# Patient Record
Sex: Male | Born: 1955 | Race: White | Hispanic: No | Marital: Married | Smoking: Never smoker
Health system: Southern US, Community
[De-identification: ages and names within clinical notes are randomized; demographics above are authoritative.]

## PROBLEM LIST (undated history)

## (undated) DIAGNOSIS — R42 Dizziness and giddiness: Secondary | ICD-10-CM

## (undated) HISTORY — PX: BUNIONECTOMY: SHX129

## (undated) HISTORY — PX: TONSILLECTOMY: SUR1361

## (undated) HISTORY — PX: OTHER SURGICAL HISTORY: SHX169

---

## 2001-10-03 ENCOUNTER — Emergency Department (HOSPITAL_COMMUNITY): Admission: EM | Admit: 2001-10-03 | Discharge: 2001-10-03 | Payer: Self-pay | Admitting: Emergency Medicine

## 2001-10-03 ENCOUNTER — Encounter: Payer: Self-pay | Admitting: Emergency Medicine

## 2014-07-02 ENCOUNTER — Ambulatory Visit (INDEPENDENT_AMBULATORY_CARE_PROVIDER_SITE_OTHER): Payer: 59

## 2014-07-02 ENCOUNTER — Ambulatory Visit (INDEPENDENT_AMBULATORY_CARE_PROVIDER_SITE_OTHER): Payer: 59 | Admitting: Podiatry

## 2014-07-02 ENCOUNTER — Encounter: Payer: Self-pay | Admitting: Podiatry

## 2014-07-02 ENCOUNTER — Ambulatory Visit: Payer: 59

## 2014-07-02 VITALS — BP 130/80 | HR 67 | Resp 16

## 2014-07-02 DIAGNOSIS — M205X2 Other deformities of toe(s) (acquired), left foot: Secondary | ICD-10-CM

## 2014-07-02 DIAGNOSIS — M2011 Hallux valgus (acquired), right foot: Secondary | ICD-10-CM

## 2014-07-02 DIAGNOSIS — M2041 Other hammer toe(s) (acquired), right foot: Secondary | ICD-10-CM

## 2014-07-02 NOTE — Progress Notes (Signed)
   Subjective:    Patient ID: Derrick Manning, male    DOB: 07-16-1955, 58 y.o.   MRN: [id number]  HPI Comments: "I wanted to get my check up for the end of the year"  Patient c/o 1st MPJ and 2nd toe right foot for several years. The area is larger and the toe looks more crooked. He just wants an evaluation. Any preventative measures?  Foot Pain  Toe Pain       Review of Systems  All other systems reviewed and are negative.      Objective:   Physical Exam: I have reviewed his past medical history medications allergies surgery social history and review of systems. Ulcers are strongly palpable bilateral. Neurologic sensorium is intact percent lasting monofilament. Deep tendon reflexes are intact bilateral muscle strength +5 over 5 dorsiflexion plantar flexors and inverters everters all intrinsic musculature is intact. Orthopedic evaluation demonstrates all joints distal to the ankle for range of motion with the exception of the first metatarsophalangeal joint of the left foot. He does have hallux abductovalgus deformity otherwise noted his bunion deformity to the first metatarsophalangeal joint of the right foot which is resulting in a mild dorsal contraction of the second metatarsophalangeal joint. This is resulting in a mild hammertoe deformity. Radiographically the left foot demonstrates mild osteoarthritis to Lisfranc's joints or the midfoot. First metatarsophalangeal joint left foot demonstrates a completely destroyed joint absolutely no joint space is present dorsal spurring medial spurring secondary to a slightly elevated first metatarsal or possibly trauma. Joint space narrowing and subchondral sclerosis subchondral eburnation and spurring demonstrating hallux rigidus. The contralateral foot demonstrates hallux valgus deformity with an increase in the first intermetatarsal angle and some joint space narrowing with dorsal spurring just beginning. Hammertoe deformity with an elongated second  metatarsal is prominent second right. Joint space narrowing with dorsal spurring is also noted midfoot right. Cutaneous evaluation demonstrates a well-hydrated cutis some irritation of the medial aspect of the PIPJ second right associated with the juxtaposition of the right hallux. Very small excoriation to the medial aspect of the second digit left. Neither of which seemed to be infected.        Assessment & Plan:  Assessment: Hallux rigidus left foot. Hallux abductovalgus deformity (bunion) right foot. Hammertoe deformity second digit right foot. Osteoarthritis mid foot bilateral. Dorsal spurring secondary to osteoarthritis right foot.  Plan: We discussed the etiology pathology conservative versus surgical therapies. At this point he was supplied with several different types of padding to help prevent skin breakdown between his toes. We did discuss the possible surgical interventions and he will consider these for the near future. I will follow-up with him approximately 1 month prior to surgical intervention.

## 2014-07-02 NOTE — Patient Instructions (Addendum)
Bunion (Hallux Valgus) A bony bump (protrusion) on the inside of the foot, at the base of the first toe, is called a bunion (hallux valgus). A bunion causes the first toe to angle toward the other toes. SYMPTOMS   A bony bump on the inside of the foot, causing an outward turning of the first toe. It may also overlap the second toe.  Thickening of the skin (callus) over the bony bump.  Fluid buildup under the callus. Fluid may become red, tender, and swollen (inflamed) with constant irritation or pressure.  Foot pain and stiffness. CAUSES  Many causes exist, including:  Inherited from your family (genetics).  Injury (trauma) forcing the first toe into a position in which it overlaps other toes.  Bunions are also associated with wearing shoes that have a narrow toe box (pointy shoes). RISK INCREASES WITH:  Family history of foot abnormalities, especially bunions.  Arthritis.  Narrow shoes, especially high heels. PREVENTION  Wear shoes with a wide toe box.  Avoid shoes with high heels.  Wear a small pad between the big toe and second toe.  Maintain proper conditioning:  Foot and ankle flexibility.  Muscle strength and endurance. PROGNOSIS  With proper treatment, bunions can typically be cured. Occasionally, surgery is required.  RELATED COMPLICATIONS   Infection of the bunion.  Arthritis of the first toe.  Risks of surgery, including infection, bleeding, injury to nerves (numb toe), recurrent bunion, overcorrection (toe points inward), arthritis of the big toe, big toe pointing upward, and bone not healing. TREATMENT  Treatment first consists of stopping the activities that aggravate the pain, taking pain medicines, and icing to reduce inflammation and pain. Wear shoes with a wide toe box. Shoes can be modified by a shoe repair person to relieve pressure on the bunion, especially if you cannot find shoes with a wide enough toe box. You may also place a pad with the  center cut out in your shoe, to reduce pressure on the bunion. Sometimes, an arch support (orthotic) may reduce pressure on the bunion and alleviate the symptoms. Stretching and strengthening exercises for the muscles of the foot may be useful. You may choose to wear a brace or pad at night to hold the big toe away from the second toe. If non-surgical treatments are not successful, surgery may be needed. Surgery involves removing the overgrown tissue and correcting the position of the first toe, by realigning the bones. Bunion surgery is typically performed on an outpatient basis, meaning you can go home the same day as surgery. The surgery may involve cutting the mid portion of the bone of the first toe, or just cutting and repairing (reconstructing) the ligaments and soft tissues around the first toe.  MEDICATION   If pain medicine is needed, nonsteroidal anti-inflammatory medicines, such as aspirin and ibuprofen, or other minor pain relievers, such as acetaminophen, are often recommended.  Do not take pain medicine for 7 days before surgery.  Prescription pain relievers are usually only prescribed after surgery. Use only as directed and only as much as you need.  Ointments applied to the skin may be helpful. HEAT AND COLD  Cold treatment (icing) relieves pain and reduces inflammation. Cold treatment should be applied for 10 to 15 minutes every 2 to 3 hours for inflammation and pain and immediately after any activity that aggravates your symptoms. Use ice packs or an ice massage.  Heat treatment may be used prior to performing the stretching and strengthening activities prescribed by your   caregiver, physical therapist, or athletic trainer. Use a heat pack or a warm soak. SEEK MEDICAL CARE IF:   Symptoms get worse or do not improve in 2 weeks, despite treatment.  After surgery, you develop fever, increasing pain, redness, swelling, drainage of fluids, bleeding, or increasing warmth around the  surgical area.  New, unexplained symptoms develop. (Drugs used in treatment may produce side effects.) Document Released: 06/21/2005 Document Revised: 09/13/2011 Document Reviewed: 10/03/2008 [facility] Patient Information 2015 [employer]. This information is not intended to replace advice given to you by your health care provider. Make sure you discuss any questions you have with your health care provider.      Hammer Toes Hammer toes is a condition in which one or more of your toes is permanently flexed. CAUSES  This happens when a muscle imbalance or abnormal bone length makes your small toes buckle. This causes the toe joint to contract and the strong cord-like bands that attach muscles to the bones (tendons) in your toes to shorten.  SIGNS AND SYMPTOMS  Common symptoms of flexible hammer toes include:   A buildup of skin cells (corns). Corns occur where boney bumps come in frequent contact with hard surfaces. For example, where your shoes press and rub.  Irritation.  Inflammation.  Pain.  Limited motion in your toes. DIAGNOSIS  Hammer toes are diagnosed through a physical exam of your toes. During the exam, your health care provider may try to reproduce your symptoms by manipulating your foot. Often, X-ray exams are done to determine the degree of deformity and to make sure that the cause is not a fracture.  TREATMENT  Hammer toes can be treated with corrective surgery. There are several types of surgical procedures that can treat hammer toes. The most common procedures include:  Arthroplasty--A portion of the joint is surgically removed and your toe is straightened. The gap fills in with fibrous tissue. This procedure helps treat pain and deformity and helps restore function.  Fusion--Cartilage between the two bones of the affected joint is taken out and the bones fuse together into one longer bone. This helps keep your toe stable and reduces pain but leaves your toe stiff,  yet straight.  Implantation--A portion of your bone is removed and replaced with an implant to restore motion.  Flexor tendon transfers--This procedure repositions the tendons that curl the toes down (flexor tendons). This may be done to release the deforming force that causes your toe to buckle. Several of these procedures require fixing your toe with a pin that is visible at the tip of your toe. The pin keeps the toe straight during healing. Your health care provider will remove the pin usually within 4-8 weeks after the procedure.  Document Released: 06/18/2000 Document Revised: 06/26/2013 Document Reviewed: 02/26/2013 [facility] Patient Information 2015 [employer], [place]. This information is not intended to replace advice given to you by your health care provider. Make sure you discuss any questions you have with your health care provider.    Hallux Limitus Hallux limitus is a condition involving pain and a loss of motion of the first (big) toe. The pain gets worse with lifting up (extension) of the toe. This is usually due to arthritic bony bumps (spurring) of the joint at the base of the big toe.  SYMPTOMS   Pain, with lifting up of the toe.  Tenderness over the joint where the big toe meets the foot.  Redness, swelling, and warmth over the top of the base of the  big toe (sometimes).  Foot pain, stiffness, and limping. CAUSES  Hallux limitus is caused by arthritis of the joint where the big toe meets the foot. The arthritis creates a bone spur that pinches the soft tissues when the toe is extended. RISK INCREASES WITH:  Tight shoes with a narrow toe box.  Family history of foot problems.  Gout and rheumatoid and psoriatic arthritis.  History of previous toe injury, including "turf toe."  Long first toe, flat feet, and other big toe bony bumps.  Arthritis of the big toe. PREVENTION   Wear wide-toed shoes that fit well.  Tape the big toe to reduce motion and to prevent  pinching of the tissues between the bone.  Maintain physical fitness:  Foot and ankle flexibility.  Muscle strength and endurance. PROGNOSIS  This condition can usually be managed with proper treatment. However, surgery is typically required to prevent the problem from recurring.  RELATED COMPLICATIONS  Injury to other areas of the foot or ankle, caused by abnormal walking in an attempt to avoid the pain felt when walking normally. TREATMENT Treatment first involves stopping the activities that aggravate your symptoms. Ice and medicine can be used to reduce the pain and inflammation. Modifications to shoes may help reduce pain, including wearing stiff-soled shoes, shoes with a wide toe box, inserting a padded donut to relieve pressure on top of the joint, or wearing an arch support. Corticosteroid injections may be given to reduce inflammation. If nonsurgical treatment is unsuccessful, surgery may be needed. Surgical options include removing the arthritic bony spur, cutting a bone in the foot to change the arc of motion (allowing the toe to extend more), or fusion of the joint (eliminating all motion in the joint at the base of the big toe).  MEDICATION   If pain medicine is needed, nonsteroidal anti-inflammatory medicines (aspirin and ibuprofen), or other minor pain relievers (acetaminophen), are often advised.  Do not take pain medicine for 7 days before surgery.  Prescription pain relievers are usually prescribed only after surgery. Use only as directed and only as much as you need.  Ointments for arthritis, applied to the skin, may give some relief.  Injections of corticosteroids may be given to reduce inflammation. HEAT AND COLD  Cold treatment (icing) relieves pain and reduces inflammation. Cold treatment should be applied for 10 to 15 minutes every 2 to 3 hours, and immediately after activity that aggravates your symptoms. Use ice packs or an ice massage.  Heat treatment may be  used before performing the stretching and strengthening activities prescribed by your caregiver, physical therapist, or athletic trainer. Use a heat pack or a warm water soak. SEEK MEDICAL CARE IF:   Symptoms get worse or do not improve in 2 weeks, despite treatment.  After surgery you develop fever, increasing pain, redness, swelling, drainage of fluids, bleeding, or increasing warmth.  New, unexplained symptoms develop.

## 2015-09-15 ENCOUNTER — Ambulatory Visit (INDEPENDENT_AMBULATORY_CARE_PROVIDER_SITE_OTHER): Payer: Commercial Managed Care - HMO

## 2015-09-15 ENCOUNTER — Encounter: Payer: Self-pay | Admitting: Podiatry

## 2015-09-15 ENCOUNTER — Ambulatory Visit (INDEPENDENT_AMBULATORY_CARE_PROVIDER_SITE_OTHER): Payer: Commercial Managed Care - HMO | Admitting: Podiatry

## 2015-09-15 VITALS — BP 131/70 | HR 70 | Resp 16

## 2015-09-15 DIAGNOSIS — M2011 Hallux valgus (acquired), right foot: Secondary | ICD-10-CM

## 2015-09-15 NOTE — Progress Notes (Signed)
He presents today to discuss bunion surgery that he would like to have in January of next year. He states that he would just like to talk to me about a healed and what to expect. He states this seems to be getting worse. He's been told by his wife and daughter not to have anything done that will only make it worse.  Objective: Vital signs are stable alert and oriented 3. Pulses are palpable. Moderate severe hallux abductovalgus deformity of the right foot with lateral deviation of the toe resulting in skin breakdown the medial aspect of the PIPJ second digit right. He states that he still has good range of motion of the joint and on physical examination appears to be so. Contralateral foot however does demonstrate limited range of motion more hallux rigidus style. Radiographs reviewed once again today to demonstrate an increase in the first metatarsal angle greater than normal values hallux abductus angle greater than normal values and early osteoarthritic changes.  Assessment: Hallux valgus deformity of the right foot mild hammertoe deformity second digit right foot.  Plan: We discussed etiology and pathology conservative versus surgical therapies. At this point if we were going to schedule him today [facility] However since he would like to have this done in January I suggested he follow up with me in July or August for a consent.

## 2016-01-20 ENCOUNTER — Encounter: Payer: Self-pay | Admitting: Podiatry

## 2016-01-20 ENCOUNTER — Ambulatory Visit (INDEPENDENT_AMBULATORY_CARE_PROVIDER_SITE_OTHER): Payer: Commercial Managed Care - HMO | Admitting: Podiatry

## 2016-01-20 VITALS — BP 140/76 | HR 75 | Resp 16

## 2016-01-20 DIAGNOSIS — M2011 Hallux valgus (acquired), right foot: Secondary | ICD-10-CM | POA: Diagnosis not present

## 2016-01-20 DIAGNOSIS — M2041 Other hammer toe(s) (acquired), right foot: Secondary | ICD-10-CM | POA: Diagnosis not present

## 2016-01-20 NOTE — Progress Notes (Signed)
Mr. Derrick Manning presents today for surgical consult regarding his right foot. He states that his bunion deformity appears to be getting worse and is more painful as time goes on. He states that now that he is retired he is much more active and is feeling pains of the deformity. He states this seems to be getting worse on a monthly basis at this point. He would like to consider surgical intervention January 2018. He is also concerned about a slight contracture deformity at the second metatarsophalangeal joint and a bump to the dorsal aspect of the right foot which is painful with shoe gear particularly laces and hiking boots. He denies changes in his past medical history medications and allergies. States that he has absolutely no drug allergies and takes Cialis on an as-needed basis.  Objective: Vital signs are stable he is alert and oriented 3. Pulses are strongly palpable. Neurologic sensorium is intact per Semmes-Weinstein monofilament and deep tendon reflexes are intact bilateral. Muscle strength is normal bilaterally and symmetrical. Orthopedic evaluation demonstrates hallux abductovalgus deformity of the right foot with no crepitation. Appears to be tracking but not completely track bound. He does have slight flexor contracture at the second metatarsophalangeal joint and minimal hammertoe deformity. Dorsal spurring at the level of the second metatarsal intermediate cuneiform joint is noted and painful on palpation with compression of the deep peroneal nerve. Radiographs reviewed today does demonstrate osteophytic changes and osteoarthritic changes of the second metatarsal medial cuneiform joint. Hallux abductovalgus deformity of the right foot and a slight contracture deformity at the second metatarsophalangeal joint. Bone stock appears to be normal and healthy. Cutaneous evaluation demonstrates no open lesions or wounds. Mild erythema overlying the hypertrophic medial condyle and medial eminence of the first  metatarsophalangeal joint.  Assessment: Hallux abductovalgus deformity of the right foot is noted. Contracted metatarsophalangeal joint second right. Hammertoe deformity second right. 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  Plan: We discussed the etiology pathology conservative versus surgical therapies. At this point he would like to have surgical correction of these 3 primary complications. We consented in today for a [facility] bunion repair with screw fixation a release of the second metatarsophalangeal joint as well as a dorsal tarsal exostectomy. I answered all the questions regarding these procedures to the best of my ability in layman's terms He understood this was amenable to it and signed all 3 pages of the consent form. We dispensed a Cam Walker which he will bring with him on the date of surgery we discussed [facility] and that this will require general anesthesia as well as a sciatic nerve block. We dispensed paperwork terms preoperative instructions for his preoperative use the day before surgery. We will follow-up with him in January.

## 2016-01-20 NOTE — Patient Instructions (Signed)

## 2016-07-15 ENCOUNTER — Other Ambulatory Visit: Payer: Self-pay | Admitting: Podiatry

## 2016-07-15 MED ORDER — ONDANSETRON HCL 4 MG PO TABS: 4.0000 mg | ORAL_TABLET | Freq: Three times a day (TID) | ORAL | 0 refills | Status: DC | PRN

## 2016-07-15 MED ORDER — HYDROMORPHONE HCL 4 MG PO TABS: 4.0000 mg | ORAL_TABLET | ORAL | 0 refills | Status: DC | PRN

## 2016-07-15 MED ORDER — CEPHALEXIN 500 MG PO CAPS: 500.0000 mg | ORAL_CAPSULE | Freq: Three times a day (TID) | ORAL | 0 refills | Status: DC

## 2016-07-16 ENCOUNTER — Encounter: Payer: Self-pay | Admitting: Podiatry

## 2016-07-16 DIAGNOSIS — M25774 Osteophyte, right foot: Secondary | ICD-10-CM | POA: Diagnosis not present

## 2016-07-16 DIAGNOSIS — M7751 Other enthesopathy of right foot: Secondary | ICD-10-CM | POA: Diagnosis not present

## 2016-07-16 DIAGNOSIS — M2011 Hallux valgus (acquired), right foot: Secondary | ICD-10-CM | POA: Diagnosis not present

## 2016-07-19 ENCOUNTER — Telehealth: Payer: Self-pay

## 2016-07-19 NOTE — Telephone Encounter (Signed)
Poke with pt regarding post op status. He states that he is doing well and managing his pain with only use of Motrin. Advised to continue with ice, elevation and rest and remain in boot at all times. He denied any s/s of infection or fever. He is to call with any acute symptom changes or concerns

## 2016-07-22 ENCOUNTER — Other Ambulatory Visit: Payer: Self-pay

## 2016-07-23 ENCOUNTER — Ambulatory Visit (INDEPENDENT_AMBULATORY_CARE_PROVIDER_SITE_OTHER): Payer: Commercial Managed Care - HMO

## 2016-07-23 ENCOUNTER — Ambulatory Visit (INDEPENDENT_AMBULATORY_CARE_PROVIDER_SITE_OTHER): Payer: Self-pay | Admitting: Podiatry

## 2016-07-23 DIAGNOSIS — M2011 Hallux valgus (acquired), right foot: Secondary | ICD-10-CM | POA: Diagnosis not present

## 2016-07-25 NOTE — Progress Notes (Signed)
Subjective:     Patient ID: Derrick Manning, male   DOB: 1955/12/27, 61 y.o.   MRN: [id number]  HPI patient states she's doing well with his right foot with mild discomfort and swelling if he walks extended.   Review of Systems     Objective:   Physical Exam Neurovascular status intact negative Homans sign was noted with well coapted incision site right first metatarsal dorsum right foot with wound edges coapted well and no drainage    Assessment:     Doing well post surgery right foot    Plan:     Advised on continued elevation compression and reapplied sterile dressing. Reappoint one week for reevaluation or earlier if any issues should occur  X-rays indicate good healing osteotomy with fixation in place and good reduction of intermetatarsal angle

## 2016-08-03 ENCOUNTER — Encounter: Payer: Self-pay | Admitting: Podiatry

## 2016-08-03 ENCOUNTER — Ambulatory Visit (INDEPENDENT_AMBULATORY_CARE_PROVIDER_SITE_OTHER): Payer: Commercial Managed Care - HMO | Admitting: Podiatry

## 2016-08-03 DIAGNOSIS — M2011 Hallux valgus (acquired), right foot: Secondary | ICD-10-CM

## 2016-08-03 NOTE — Progress Notes (Signed)
He presents today for postop visit date of surgery 07/16/2016 status post Derrick Manning bunionectomy right capsulotomy with release of the metatarsophalangeal joint and a tarsal exostectomy right foot. He states that he is doing fine with minimal discomfort. He states that he did help friend with his furniture the other day while recovering.  Objective: Vital signs are stable he is alert and oriented 3. Sterile dressing was removed demonstrates sutures are intact margins were coapted removed the suture ends today. He has good range of motion though painful first metatarsophalangeal joint of the right foot.  Assessment: Well-healing surgical foot right.  Plan: Encouraged him to keep his foot elevated and to increase his activity level with his toes and range of motion of his ankle. We will follow-up with him in 2 weeks at which time radiographs will be taken we did place him in an anklet and a Darco shoe today. He understands that he is still limited in his activities.

## 2016-08-12 NOTE — Progress Notes (Signed)
DOS 01.12.2018 [place] bunion repair right foot with screws. Release 2nd MTPj right foot. Dorsal tarsal exostectomy right foot.

## 2016-08-17 ENCOUNTER — Ambulatory Visit (INDEPENDENT_AMBULATORY_CARE_PROVIDER_SITE_OTHER): Payer: Commercial Managed Care - HMO

## 2016-08-17 ENCOUNTER — Ambulatory Visit (INDEPENDENT_AMBULATORY_CARE_PROVIDER_SITE_OTHER): Payer: Self-pay | Admitting: Podiatry

## 2016-08-17 DIAGNOSIS — M2011 Hallux valgus (acquired), right foot: Secondary | ICD-10-CM

## 2016-08-17 NOTE — Progress Notes (Signed)
He presents today for a second postop visit [facility]. He states the swelling has gone down considerably and the foot feels much better he states that he's been up on it going to the gym and being quite active. States he continues to wear his Darco shoe at all times.  Objective: All signs are stable he is alert and oriented 3. Pulses are palpable. He's had good range of motion on dorsiflexion and plantar flexion of the first metatarsophalangeal joint second metatarsophalangeal joint is in rectus position. No open lesions or wounds are noted. Radius taken today demonstrate good position of the capital osteotomy and screw fixation. No fractures are identified. There has been slight motion of the distal fragment but no large degree.  Assessment: 1 healing surgical foot.  Plan: We'll allow him to get back into regular tennis shoe and I'll follow-up with him in 1 month.

## 2016-08-31 ENCOUNTER — Ambulatory Visit (INDEPENDENT_AMBULATORY_CARE_PROVIDER_SITE_OTHER): Payer: Self-pay | Admitting: Sports Medicine

## 2016-08-31 ENCOUNTER — Ambulatory Visit (INDEPENDENT_AMBULATORY_CARE_PROVIDER_SITE_OTHER): Payer: Commercial Managed Care - HMO

## 2016-08-31 DIAGNOSIS — M898X9 Other specified disorders of bone, unspecified site: Secondary | ICD-10-CM | POA: Diagnosis not present

## 2016-08-31 DIAGNOSIS — M2011 Hallux valgus (acquired), right foot: Secondary | ICD-10-CM

## 2016-08-31 DIAGNOSIS — Z9889 Other specified postprocedural states: Secondary | ICD-10-CM

## 2016-08-31 NOTE — Progress Notes (Signed)
Subjective: Derrick Manning is a 61 y.o. male patient seen today in office for POV #4 (DOS 07-16-16), S/P Right bunionectomy and dorsal midfoot exostectomy performed by Dr. Milinda Pointer. Patient denies pain at surgical site, denies calf pain, denies headache, chest pain, shortness of breath, nausea, vomiting, fever, or chills. Patient states that he is doing well and has been in normal shoes since last week. No other issues noted.   There are no active problems to display for this patient.   Current Outpatient Prescriptions on File Prior to Visit  Medication Sig Dispense Refill  . HYDROmorphone (DILAUDID) 4 MG tablet Take 1 tablet (4 mg total) by mouth every 4 (four) hours as needed for severe pain. 30 tablet 0  . ondansetron (ZOFRAN) 4 MG tablet Take 1 tablet (4 mg total) by mouth every 8 (eight) hours as needed. 20 tablet 0   No current facility-administered medications on file prior to visit.     No Known Allergies  Objective: There were no vitals filed for this visit.  General: No acute distress, AAOx3  Right Foot: Incision sites well healed, no erythema, no warmth, no drainage, no signs of infection noted, Capillary fill time <3 seconds in all digits, gross sensation present via light touch to right foot. No pain or crepitation with range of motion right foot.  No pain with calf compression.   Post Op Xray, Right foot: 1st met in good alignment and position, There is dorsal lateral capital fragment that is prominent from lateral shift likely a part of surgery to reduce IM vs slight motion. Osteotomy site healing. Hardware intact. No other acute bony findings. Soft tissue swelling within normal limits for post op status.   Assessment and Plan:  Problem List Items Addressed This Visit    None    Visit Diagnoses    Hav (hallux abducto valgus), right    -  Primary   Relevant Orders   DG Foot Complete Right (Completed)   Exostosis, dorsal, right       POV   Relevant Orders   DG Foot Complete  Right (Completed)   S/P foot surgery, right           -Patient seen and evaluated -Xrays reviewed  -Advised patient to limit activity to necessity; no strenous exercise or loading ball of foot until next visit when cleared to do so my Dr. Milinda Pointer -May continue gentle range of motion exercises and good supportive tennis shoes  -Will plan for xray and discuss increase in physical activity at next office visit with Dr. Milinda Pointer. In the meantime, patient to call office if any issues or problems arise.   Landis Martins, DPM

## 2016-09-28 ENCOUNTER — Ambulatory Visit (INDEPENDENT_AMBULATORY_CARE_PROVIDER_SITE_OTHER): Payer: Commercial Managed Care - HMO

## 2016-09-28 ENCOUNTER — Encounter: Payer: Self-pay | Admitting: Podiatry

## 2016-09-28 ENCOUNTER — Ambulatory Visit (INDEPENDENT_AMBULATORY_CARE_PROVIDER_SITE_OTHER): Payer: Self-pay | Admitting: Podiatry

## 2016-09-28 VITALS — BP 130/80 | HR 63 | Resp 16

## 2016-09-28 DIAGNOSIS — M2011 Hallux valgus (acquired), right foot: Secondary | ICD-10-CM

## 2016-09-28 DIAGNOSIS — M898X9 Other specified disorders of bone, unspecified site: Secondary | ICD-10-CM

## 2016-09-29 NOTE — Progress Notes (Signed)
He presents today follow-up of his Derrick Manning bunionectomy date of surgery 07/16/2016. He states that is doing just great no pain in any way.  Objective: Vital signs are stable he is alert and oriented 3 no erythema or edema cellulitis drainage or odor great range of motion of the first metatarsophalangeal joint. Radiographs confirm well-healed osteotomies.  Assessment: Well-healed osteotomy dorsal tarsal exostectomy and released metatarsophalangeal joint.  Plan: We'll get back to his regular routine follow-up with me on an as-needed basis or in one month.

## 2019-02-06 ENCOUNTER — Other Ambulatory Visit: Payer: Self-pay | Admitting: Family Medicine

## 2019-02-06 DIAGNOSIS — Z720 Tobacco use: Secondary | ICD-10-CM

## 2019-02-20 ENCOUNTER — Ambulatory Visit: Payer: Self-pay

## 2019-02-21 ENCOUNTER — Ambulatory Visit
Admission: RE | Admit: 2019-02-21 | Discharge: 2019-02-21 | Disposition: A | Discharge status: Home / Self Care | Payer: Medicare Other | Source: Ambulatory Visit | Attending: Family Medicine | Admitting: Family Medicine

## 2019-02-21 DIAGNOSIS — Z720 Tobacco use: Secondary | ICD-10-CM

## 2020-09-11 ENCOUNTER — Other Ambulatory Visit: Payer: Self-pay | Admitting: Family Medicine

## 2020-09-11 DIAGNOSIS — N63 Unspecified lump in unspecified breast: Secondary | ICD-10-CM

## 2020-09-23 ENCOUNTER — Other Ambulatory Visit: Payer: Self-pay

## 2020-09-23 ENCOUNTER — Ambulatory Visit
Admission: RE | Admit: 2020-09-23 | Discharge: 2020-09-23 | Disposition: A | Discharge status: Home / Self Care | Payer: Medicare Other | Source: Ambulatory Visit | Attending: Family Medicine | Admitting: Family Medicine

## 2020-09-23 DIAGNOSIS — N63 Unspecified lump in unspecified breast: Secondary | ICD-10-CM

## 2020-09-30 ENCOUNTER — Other Ambulatory Visit: Payer: Medicare Other

## 2021-02-02 ENCOUNTER — Other Ambulatory Visit: Payer: Self-pay | Admitting: Physician Assistant

## 2021-02-02 DIAGNOSIS — R41840 Attention and concentration deficit: Secondary | ICD-10-CM

## 2021-02-02 DIAGNOSIS — R42 Dizziness and giddiness: Secondary | ICD-10-CM

## 2021-02-10 ENCOUNTER — Other Ambulatory Visit: Payer: Self-pay

## 2021-02-10 ENCOUNTER — Ambulatory Visit
Admission: RE | Admit: 2021-02-10 | Discharge: 2021-02-10 | Disposition: A | Discharge status: Home / Self Care | Payer: Medicare Other | Source: Ambulatory Visit | Attending: Physician Assistant | Admitting: Physician Assistant

## 2021-02-10 DIAGNOSIS — R42 Dizziness and giddiness: Secondary | ICD-10-CM

## 2021-02-10 DIAGNOSIS — R41840 Attention and concentration deficit: Secondary | ICD-10-CM

## 2021-02-10 MED ORDER — GADOBENATE DIMEGLUMINE 529 MG/ML IV SOLN
16.0000 mL | Freq: Once | INTRAVENOUS | Status: AC | PRN
  Administered 2021-02-10: 16 mL via INTRAVENOUS

## 2021-07-05 HISTORY — PX: OTHER SURGICAL HISTORY: SHX169

## 2022-02-12 IMAGING — MR MR HEAD WO/W CM
14 series · 48 of 48 positions shown · IV contrast (multihance)
Comparison: None.

CLINICAL DATA: Dizziness R42 (CH7-3A-CM)

Difficulty concentrating [id number] (CH7-3A-CM)
EXAM:
MRI HEAD WITHOUT AND WITH CONTRAST
TECHNIQUE: Multiplanar, multiecho pulse sequences of the brain and surrounding
structures were obtained without and with intravenous contrast.
CONTRAST:  16mL MULTIHANCE GADOBENATE DIMEGLUMINE 529 MG/ML IV SOLN

[Series 5: T1 · sagittal · 4.0mm · 0.75mm/px · 2 of 31 slices shown (1 of 2)]
[im 1/31]
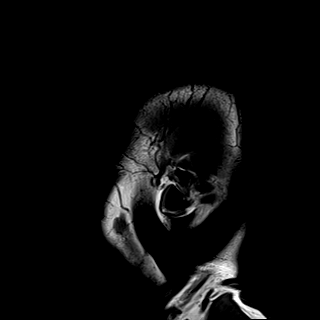
[im 31/31]
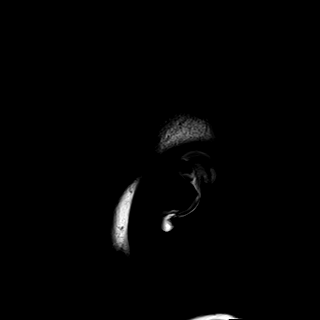

[Series 6: DWI · axial · 3.0mm · 0.94mm/px · z∈[-117,+33]mm · 9 of 175 slices shown (1 of 3)]
[im 1/175]
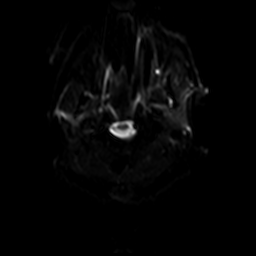
[im 22/175]
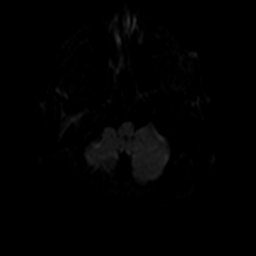
[im 44/175]
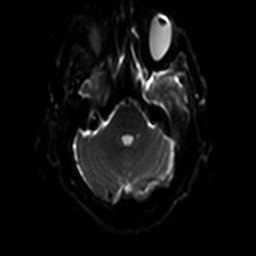
[im 66/175]
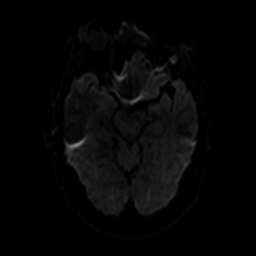
[im 88/175]
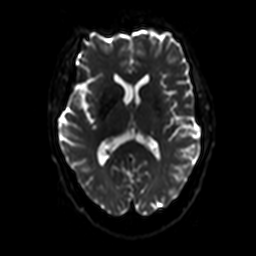
[im 109/175]
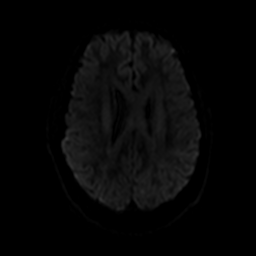
[im 131/175]
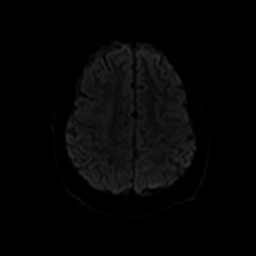
[im 153/175]
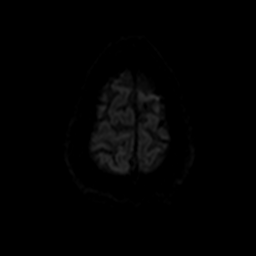
[im 175/175]
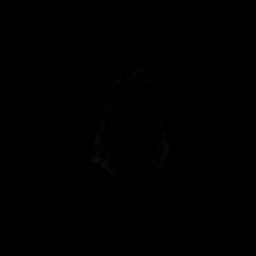

[Series 7: ax dwi_tracew · axial · 3.0mm · 0.94mm/px · z∈[-117,+33]mm · 4 of 88 slices shown]
[im 1/88]
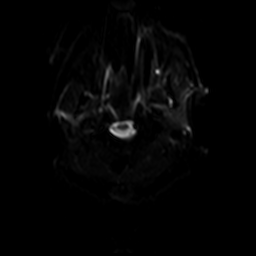
[im 30/88]
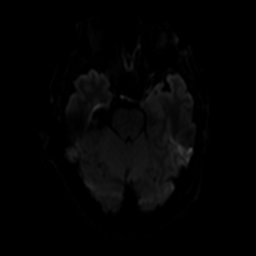
[im 59/88]
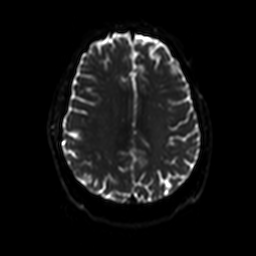
[im 88/88]
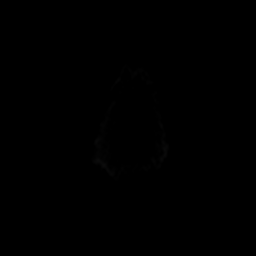

[Series 8: ax dwi_adc · axial · 3.0mm · 0.94mm/px · z∈[-117,+33]mm · 2 of 43 slices shown]
[im 1/43]
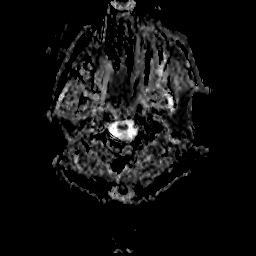
[im 43/43]
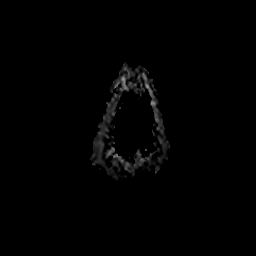

[Series 9: DWI · coronal · 5.0mm · 1.44mm/px · 3 of 72 slices shown (2 of 3)]
[im 1/72]
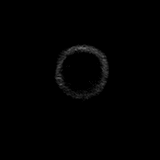
[im 36/72]
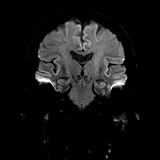
[im 72/72]
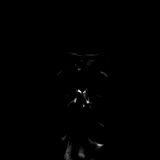

[Series 10: DWI · coronal · 5.0mm · 1.44mm/px · 2 of 36 slices shown (3 of 3)]
[im 1/36]
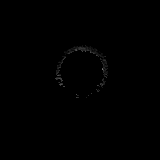
[im 36/36]
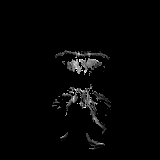

[Series 11: T2 · axial · 4.0mm · 0.36mm/px · 1 of 32 slices shown]
[im 1/32]
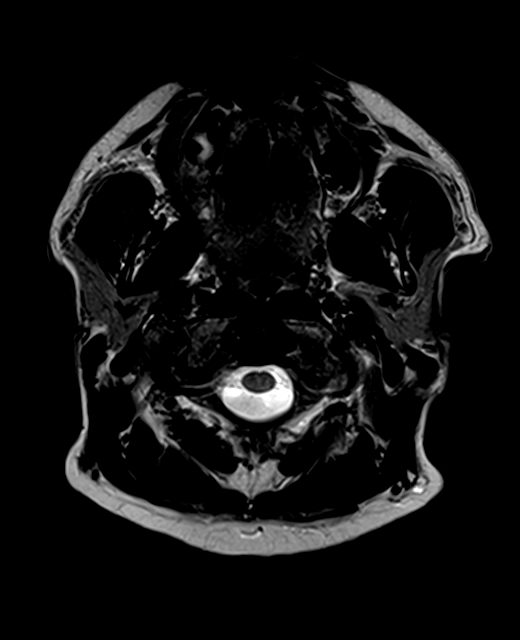

[Series 12: FLAIR · axial · 3.0mm · 0.72mm/px · 1 of 26 slices shown]
[im 1/26]
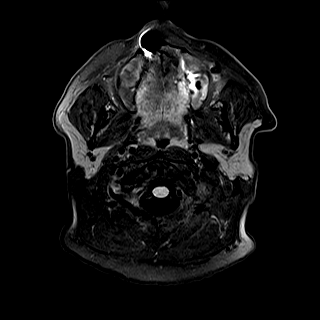

[Series 13: swi_images · axial · 1.5mm · 0.90mm/px · z∈[-114,+49]mm · 5 of 112 slices shown]
[im 1/112]
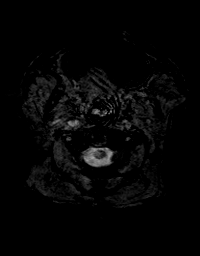
[im 28/112]
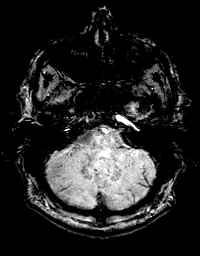
[im 56/112]
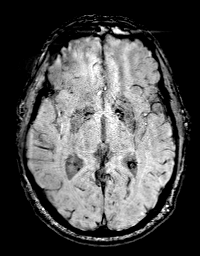
[im 84/112]
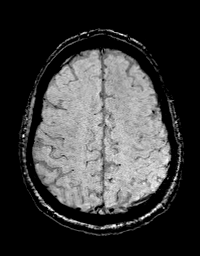
[im 112/112]
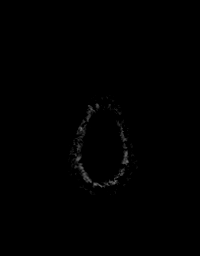

[Series 15: T1 · axial · 1.0mm · 0.94mm/px · z∈[-118,+39]mm · 7 of 160 slices shown (2 of 2)]
[im 1/160]
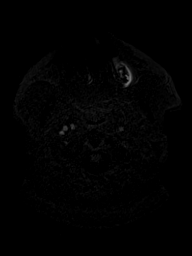
[im 27/160]
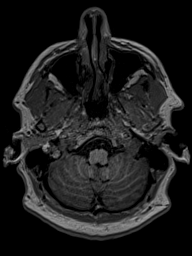
[im 54/160]
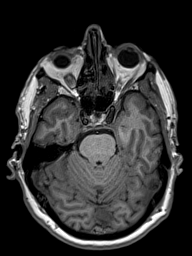
[im 80/160]
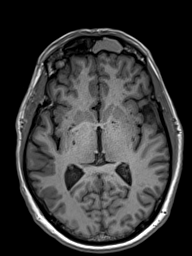
[im 107/160]
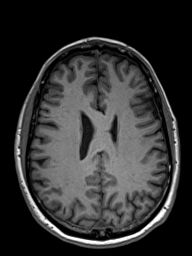
[im 133/160]
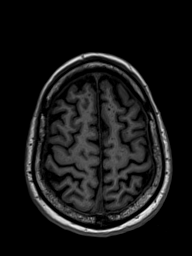
[im 160/160]
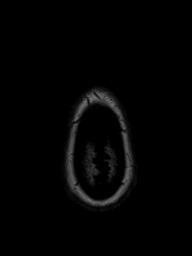

[Series 16: T2 post-contrast · coronal · 4.5mm · 0.36mm/px · 2 of 34 slices shown]
[im 1/34]
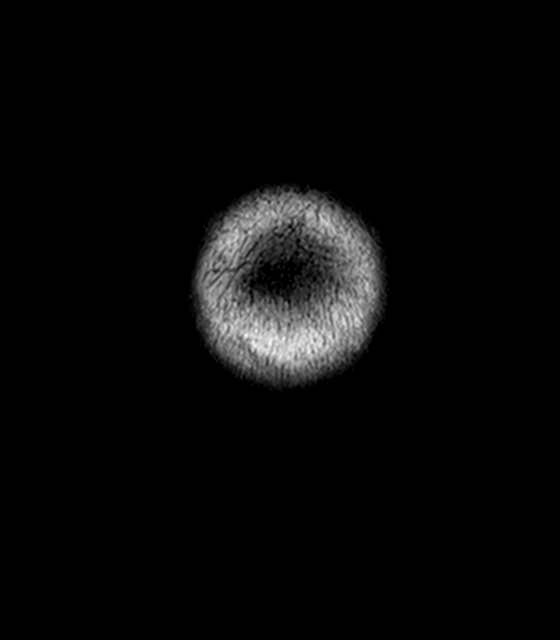
[im 34/34]
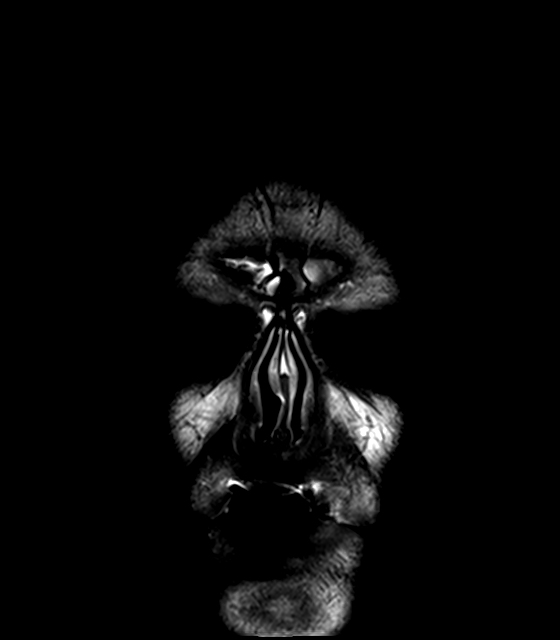

[Series 17: T1 post-contrast · axial · 1.0mm · 0.94mm/px · z∈[-118,+39]mm · 7 of 160 slices shown (1 of 3)]
[im 1/160]
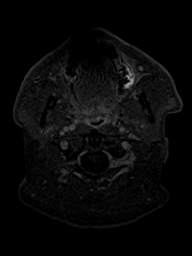
[im 27/160]
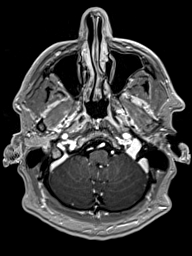
[im 54/160]
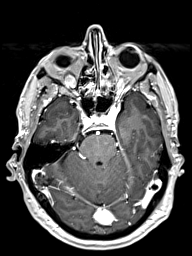
[im 80/160]
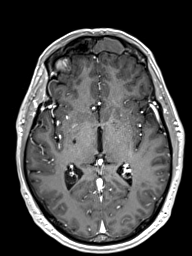
[im 107/160]
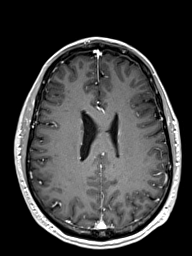
[im 133/160]
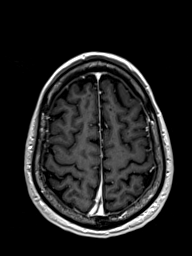
[im 160/160]
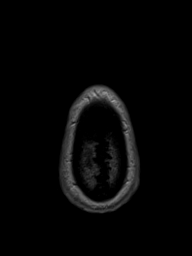

[Series 18: T1 post-contrast · coronal · 4.5mm · 0.72mm/px · 2 of 34 slices shown (2 of 3)]
[im 1/34]
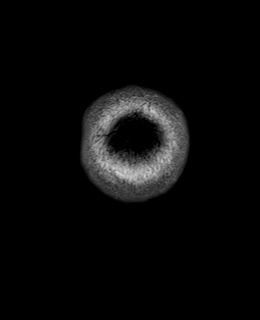
[im 34/34]
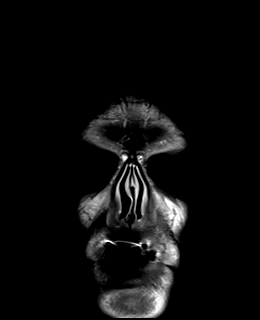

[Series 19: T1 post-contrast · sagittal · 4.0mm · 0.75mm/px · 1 of 31 slices shown (3 of 3)]
[im 1/31]
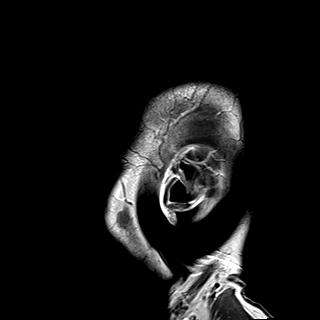

[48 of 48 positions shown; findings below may reference images not displayed]

FINDINGS: Brain: No acute infarction, hemorrhage, hydrocephalus, extra-axial
collection or mass lesion. Small 6 mm nonenhancing discrete cystic
lesion in the right frontal white matter that suppresses on FLAIR,
most likely a benign dilated perivascular space (favored) or benign
cyst. Smaller dilated perivascular spaces in bilateral inferior
basal ganglia and scattered throughout the white matter. No
associated mass effect or surrounding edema. Mild atrophy. No
abnormal enhancement.

Vascular: Major arterial flow voids are maintained skull base. Large
right and small left distal transverse sinus arachnoid granulations.

Skull and upper cervical spine: Normal marrow signal.

Sinuses/Orbits: Completely opacified left frontal sinus with
possible mildly T1 hyperintense nonenhancing lesion within the
sinus, possibly a mucocele that extends into the left
frontoethmoidal recess. Moderate mucosal thickening of the right
frontal sinus, scattered ethmoid air cells, inferior maxillary
sinuses, and right greater than left sphenoid sinuses.

Other: No mastoid effusions.
IMPRESSION: 1. No evidence of acute intracranial abnormality.
2. Moderate to severe paranasal sinus disease, detailed above and
suggestive of sinusitis. Suspected underlying mucocele in the left
frontal sinus, extending into the left frontoethmoidal recess. A
paranasal sinus CT after antibiotic treatment could further evaluate
if clinically indicated.

## 2022-04-16 ENCOUNTER — Other Ambulatory Visit: Payer: Self-pay | Admitting: Urology

## 2022-06-01 ENCOUNTER — Encounter (HOSPITAL_BASED_OUTPATIENT_CLINIC_OR_DEPARTMENT_OTHER): Payer: Self-pay | Admitting: Urology

## 2022-06-01 NOTE — Progress Notes (Signed)
Spoke w/ via phone for pre-op interview--- Freida Busman Lab needs dos----    NONE           Lab results------ COVID test -----patient states asymptomatic no test needed Arrive at -------1200 NPO after MN NO Solid Food.  Clear liquids from MN until---1100 Med rec completed Medications to take morning of surgery -----NONE Diabetic medication ----- Patient instructed no nail polish to be worn day of surgery Patient instructed to bring photo id and insurance card day of surgery Patient aware to have Driver (ride ) / caregiver   Wife Robette for 24 hours after surgery  Patient Special Instructions ----- OWER, RCC guidelines discussed, discharge by 9am the next morning, patient verbalized understanding. Pre-Op special Istructions ----- Patient verbalized understanding of instructions that were given at this phone interview. Patient denies shortness of breath, chest pain, fever, cough at this phone interview.

## 2022-06-04 NOTE — Progress Notes (Signed)
Pt aware of time change in schedule. To come in at 1145. Clear liquids until 1045

## 2022-06-07 ENCOUNTER — Ambulatory Visit (HOSPITAL_BASED_OUTPATIENT_CLINIC_OR_DEPARTMENT_OTHER): Payer: Medicare Other | Admitting: Anesthesiology

## 2022-06-07 ENCOUNTER — Encounter (HOSPITAL_BASED_OUTPATIENT_CLINIC_OR_DEPARTMENT_OTHER): Payer: Self-pay | Admitting: Urology

## 2022-06-07 ENCOUNTER — Ambulatory Visit (HOSPITAL_BASED_OUTPATIENT_CLINIC_OR_DEPARTMENT_OTHER)
Admission: RE | Admit: 2022-06-07 | Discharge: 2022-06-08 | Disposition: A | Discharge status: Home / Self Care | Payer: Medicare Other | Source: Ambulatory Visit | Attending: Urology | Admitting: Urology

## 2022-06-07 ENCOUNTER — Encounter (HOSPITAL_BASED_OUTPATIENT_CLINIC_OR_DEPARTMENT_OTHER)
Admission: RE | Disposition: A | Discharge status: Home / Self Care | Payer: Self-pay | Source: Ambulatory Visit | Attending: Urology

## 2022-06-07 ENCOUNTER — Other Ambulatory Visit: Payer: Self-pay

## 2022-06-07 DIAGNOSIS — N138 Other obstructive and reflux uropathy: Secondary | ICD-10-CM | POA: Diagnosis not present

## 2022-06-07 DIAGNOSIS — N401 Enlarged prostate with lower urinary tract symptoms: Secondary | ICD-10-CM | POA: Diagnosis not present

## 2022-06-07 DIAGNOSIS — N400 Enlarged prostate without lower urinary tract symptoms: Secondary | ICD-10-CM | POA: Diagnosis present

## 2022-06-07 DIAGNOSIS — Z87891 Personal history of nicotine dependence: Secondary | ICD-10-CM | POA: Insufficient documentation

## 2022-06-07 DIAGNOSIS — N529 Male erectile dysfunction, unspecified: Secondary | ICD-10-CM | POA: Diagnosis not present

## 2022-06-07 DIAGNOSIS — Z79899 Other long term (current) drug therapy: Secondary | ICD-10-CM | POA: Diagnosis not present

## 2022-06-07 HISTORY — DX: Dizziness and giddiness: R42

## 2022-06-07 HISTORY — PX: TRANSURETHRAL RESECTION OF PROSTATE: SHX73

## 2022-06-07 LAB — CBC
HCT: 44.1 % (ref 39.0–52.0)
Hemoglobin: 14.5 g/dL (ref 13.0–17.0)
MCH: 30.8 pg (ref 26.0–34.0)
MCHC: 32.9 g/dL (ref 30.0–36.0)
MCV: 93.6 fL (ref 80.0–100.0)
Platelets: 173 10*3/uL (ref 150–400)
RBC: 4.71 MIL/uL (ref 4.22–5.81)
RDW: 12.7 % (ref 11.5–15.5)
WBC: 8.7 10*3/uL (ref 4.0–10.5)
nRBC: 0 % (ref 0.0–0.2)

## 2022-06-07 LAB — BASIC METABOLIC PANEL
Anion gap: 6 (ref 5–15)
BUN: 17 mg/dL (ref 8–23)
CO2: 27 mmol/L (ref 22–32)
Calcium: 8.5 mg/dL — ABNORMAL LOW (ref 8.9–10.3)
Chloride: 105 mmol/L (ref 98–111)
Creatinine, Ser: 1.19 mg/dL (ref 0.61–1.24)
GFR, Estimated: >60 mL/min (ref >60)
Glucose, Bld: 262 mg/dL — ABNORMAL HIGH (ref 70–99)
Potassium: 3.6 mmol/L (ref 3.5–5.1)
Sodium: 138 mmol/L (ref 135–145)

## 2022-06-07 SURGERY — TURP (TRANSURETHRAL RESECTION OF PROSTATE)
Anesthesia: General | Site: Prostate

## 2022-06-07 MED ORDER — ADULT MULTIVITAMIN W/MINERALS CH
1.0000 | ORAL_TABLET | Freq: Every day | ORAL | Status: DC
  Filled 2022-06-07: qty 1

## 2022-06-07 MED ORDER — DEXAMETHASONE SODIUM PHOSPHATE 10 MG/ML IJ SOLN
INTRAMUSCULAR | Status: AC
  Filled 2022-06-07: qty 1

## 2022-06-07 MED ORDER — SODIUM CHLORIDE 0.9 % IV SOLN: 250.0000 mL | INTRAVENOUS | Status: DC | PRN

## 2022-06-07 MED ORDER — ONDANSETRON HCL 4 MG/2ML IJ SOLN
INTRAMUSCULAR | Status: DC | PRN
  Administered 2022-06-07: 4 mg via INTRAVENOUS

## 2022-06-07 MED ORDER — OXYCODONE HCL 5 MG PO TABS
5.0000 mg | ORAL_TABLET | ORAL | Status: DC | PRN
  Administered 2022-06-07: 5 mg via ORAL

## 2022-06-07 MED ORDER — DIPHENHYDRAMINE HCL 50 MG/ML IJ SOLN: 12.5000 mg | Freq: Four times a day (QID) | INTRAMUSCULAR | Status: DC | PRN

## 2022-06-07 MED ORDER — LIDOCAINE 2% (20 MG/ML) 5 ML SYRINGE
INTRAMUSCULAR | Status: DC | PRN
  Administered 2022-06-07: 60 mg via INTRAVENOUS

## 2022-06-07 MED ORDER — PROPOFOL 10 MG/ML IV BOLUS
INTRAVENOUS | Status: AC
  Filled 2022-06-07: qty 20

## 2022-06-07 MED ORDER — ONDANSETRON HCL 4 MG/2ML IJ SOLN: 4.0000 mg | INTRAMUSCULAR | Status: DC | PRN

## 2022-06-07 MED ORDER — HEPARIN SODIUM (PORCINE) 5000 UNIT/ML IJ SOLN
INTRAMUSCULAR | Status: AC
  Filled 2022-06-07: qty 1

## 2022-06-07 MED ORDER — MIDAZOLAM HCL 2 MG/2ML IJ SOLN
INTRAMUSCULAR | Status: AC
  Filled 2022-06-07: qty 2

## 2022-06-07 MED ORDER — ONDANSETRON HCL 4 MG/2ML IJ SOLN
INTRAMUSCULAR | Status: AC
  Filled 2022-06-07: qty 2

## 2022-06-07 MED ORDER — OXYCODONE-ACETAMINOPHEN 5-325 MG PO TABS: 1.0000 | ORAL_TABLET | ORAL | 0 refills | Status: DC | PRN

## 2022-06-07 MED ORDER — STERILE WATER FOR IRRIGATION IR SOLN
Status: DC | PRN
  Administered 2022-06-07: 500 mL

## 2022-06-07 MED ORDER — DOCUSATE SODIUM 100 MG PO CAPS
ORAL_CAPSULE | ORAL | Status: AC
  Filled 2022-06-07: qty 1

## 2022-06-07 MED ORDER — AMISULPRIDE (ANTIEMETIC) 5 MG/2ML IV SOLN: 10.0000 mg | Freq: Once | INTRAVENOUS | Status: DC | PRN

## 2022-06-07 MED ORDER — CEFAZOLIN SODIUM-DEXTROSE 2-4 GM/100ML-% IV SOLN
2.0000 g | INTRAVENOUS | Status: AC
  Administered 2022-06-07: 2 g via INTRAVENOUS

## 2022-06-07 MED ORDER — ACETAMINOPHEN 500 MG PO TABS
1000.0000 mg | ORAL_TABLET | Freq: Four times a day (QID) | ORAL | Status: DC
  Administered 2022-06-07 – 2022-06-08 (×3): 1000 mg via ORAL

## 2022-06-07 MED ORDER — LIDOCAINE HCL (PF) 2 % IJ SOLN
INTRAMUSCULAR | Status: AC
  Filled 2022-06-07: qty 5

## 2022-06-07 MED ORDER — LACTATED RINGERS IV SOLN
INTRAVENOUS | Status: DC
  Administered 2022-06-07: 1000 mL via INTRAVENOUS

## 2022-06-07 MED ORDER — MORPHINE SULFATE (PF) 4 MG/ML IV SOLN: 2.0000 mg | INTRAVENOUS | Status: DC | PRN

## 2022-06-07 MED ORDER — FENTANYL CITRATE (PF) 100 MCG/2ML IJ SOLN
INTRAMUSCULAR | Status: DC | PRN
  Administered 2022-06-07 (×2): 25 ug via INTRAVENOUS
  Administered 2022-06-07: 50 ug via INTRAVENOUS

## 2022-06-07 MED ORDER — ZOLPIDEM TARTRATE 5 MG PO TABS: 5.0000 mg | ORAL_TABLET | Freq: Every evening | ORAL | Status: DC | PRN

## 2022-06-07 MED ORDER — PROPOFOL 10 MG/ML IV BOLUS
INTRAVENOUS | Status: DC | PRN
  Administered 2022-06-07: 200 mg via INTRAVENOUS
  Administered 2022-06-07: 10 mg via INTRAVENOUS

## 2022-06-07 MED ORDER — OXYBUTYNIN CHLORIDE ER 10 MG PO TB24
ORAL_TABLET | ORAL | Status: AC
  Filled 2022-06-07: qty 1

## 2022-06-07 MED ORDER — FENTANYL CITRATE (PF) 100 MCG/2ML IJ SOLN
INTRAMUSCULAR | Status: AC
  Filled 2022-06-07: qty 2

## 2022-06-07 MED ORDER — LORATADINE 10 MG PO TABS
10.0000 mg | ORAL_TABLET | Freq: Every day | ORAL | Status: DC
  Administered 2022-06-07: 10 mg via ORAL

## 2022-06-07 MED ORDER — ACETAMINOPHEN 500 MG PO TABS: 1000.0000 mg | ORAL_TABLET | Freq: Once | ORAL | Status: DC

## 2022-06-07 MED ORDER — LORATADINE 10 MG PO TABS
ORAL_TABLET | ORAL | Status: AC
  Filled 2022-06-07: qty 1

## 2022-06-07 MED ORDER — SODIUM CHLORIDE 0.9 % IR SOLN
3000.0000 mL | Status: DC
  Administered 2022-06-07 – 2022-06-08 (×2): 3000 mL

## 2022-06-07 MED ORDER — SODIUM CHLORIDE 0.9 % IV SOLN: INTRAVENOUS | Status: DC

## 2022-06-07 MED ORDER — ACETAMINOPHEN 325 MG PO TABS: 650.0000 mg | ORAL_TABLET | ORAL | Status: DC | PRN

## 2022-06-07 MED ORDER — SODIUM CHLORIDE 0.9% FLUSH: 3.0000 mL | INTRAVENOUS | Status: DC | PRN

## 2022-06-07 MED ORDER — DOCUSATE SODIUM 100 MG PO CAPS
100.0000 mg | ORAL_CAPSULE | Freq: Two times a day (BID) | ORAL | Status: DC
  Administered 2022-06-07 (×2): 100 mg via ORAL

## 2022-06-07 MED ORDER — PHENYLEPHRINE 80 MCG/ML (10ML) SYRINGE FOR IV PUSH (FOR BLOOD PRESSURE SUPPORT)
PREFILLED_SYRINGE | INTRAVENOUS | Status: AC
  Filled 2022-06-07: qty 10

## 2022-06-07 MED ORDER — OXYBUTYNIN CHLORIDE ER 10 MG PO TB24
10.0000 mg | ORAL_TABLET | Freq: Every day | ORAL | Status: DC
  Administered 2022-06-07: 10 mg via ORAL

## 2022-06-07 MED ORDER — ACETAMINOPHEN 500 MG PO TABS
ORAL_TABLET | ORAL | Status: AC
  Filled 2022-06-07: qty 2

## 2022-06-07 MED ORDER — EPHEDRINE 5 MG/ML INJ
INTRAVENOUS | Status: AC
  Filled 2022-06-07: qty 5

## 2022-06-07 MED ORDER — SODIUM CHLORIDE 0.9 % IR SOLN
Status: DC | PRN
  Administered 2022-06-07 (×4): 6000 mL via INTRAVESICAL
  Administered 2022-06-07: 3000 mL via INTRAVESICAL

## 2022-06-07 MED ORDER — EPHEDRINE SULFATE-NACL 50-0.9 MG/10ML-% IV SOSY
PREFILLED_SYRINGE | INTRAVENOUS | Status: DC | PRN
  Administered 2022-06-07: 10 mg via INTRAVENOUS
  Administered 2022-06-07: 5 mg via INTRAVENOUS

## 2022-06-07 MED ORDER — HEPARIN SODIUM (PORCINE) 5000 UNIT/ML IJ SOLN
5000.0000 [IU] | Freq: Three times a day (TID) | INTRAMUSCULAR | Status: DC
  Administered 2022-06-07 – 2022-06-08 (×2): 5000 [IU] via SUBCUTANEOUS

## 2022-06-07 MED ORDER — SODIUM CHLORIDE 0.9% FLUSH: 3.0000 mL | Freq: Two times a day (BID) | INTRAVENOUS | Status: DC

## 2022-06-07 MED ORDER — DOCUSATE SODIUM 100 MG PO CAPS: 100.0000 mg | ORAL_CAPSULE | Freq: Every day | ORAL | 0 refills | Status: AC | PRN

## 2022-06-07 MED ORDER — FENTANYL CITRATE (PF) 100 MCG/2ML IJ SOLN: 25.0000 ug | INTRAMUSCULAR | Status: DC | PRN

## 2022-06-07 MED ORDER — POLYETHYLENE GLYCOL 3350 17 G PO PACK: 17.0000 g | PACK | Freq: Every day | ORAL | Status: DC | PRN

## 2022-06-07 MED ORDER — MIDAZOLAM HCL 5 MG/5ML IJ SOLN
INTRAMUSCULAR | Status: DC | PRN
  Administered 2022-06-07: 2 mg via INTRAVENOUS

## 2022-06-07 MED ORDER — DIPHENHYDRAMINE HCL 12.5 MG/5ML PO ELIX: 12.5000 mg | ORAL_SOLUTION | Freq: Four times a day (QID) | ORAL | Status: DC | PRN

## 2022-06-07 MED ORDER — PHENYLEPHRINE 80 MCG/ML (10ML) SYRINGE FOR IV PUSH (FOR BLOOD PRESSURE SUPPORT)
PREFILLED_SYRINGE | INTRAVENOUS | Status: DC | PRN
  Administered 2022-06-07 (×4): 80 ug via INTRAVENOUS

## 2022-06-07 MED ORDER — OXYCODONE HCL 5 MG PO TABS
ORAL_TABLET | ORAL | Status: AC
  Filled 2022-06-07: qty 1

## 2022-06-07 MED ORDER — CEFAZOLIN SODIUM-DEXTROSE 2-4 GM/100ML-% IV SOLN
INTRAVENOUS | Status: AC
  Filled 2022-06-07: qty 100

## 2022-06-07 MED ORDER — DEXAMETHASONE SODIUM PHOSPHATE 10 MG/ML IJ SOLN
INTRAMUSCULAR | Status: DC | PRN
  Administered 2022-06-07: 5 mg via INTRAVENOUS

## 2022-06-07 SURGICAL SUPPLY — 25 items
BAG DRAIN URO-CYSTO SKYTR STRL (DRAIN) ×1 IMPLANT
BAG DRN RND TRDRP ANRFLXCHMBR (UROLOGICAL SUPPLIES) ×1
BAG DRN UROCATH (DRAIN) ×1
BAG URINE DRAIN 2000ML AR STRL (UROLOGICAL SUPPLIES) ×1 IMPLANT
BAG URINE LEG 500ML (DRAIN) IMPLANT
CATH FOLEY 2WAY SLVR 30CC 20FR (CATHETERS) IMPLANT
CATH FOLEY 3WAY 30CC 22FR (CATHETERS) ×1 IMPLANT
CLOTH BEACON ORANGE TIMEOUT ST (SAFETY) ×1 IMPLANT
ELECT BIVAP BIPO 22/24 DONUT (ELECTROSURGICAL)
ELECT REM PT RETURN 9FT ADLT (ELECTROSURGICAL)
ELECTRD BIVAP BIPO 22/24 DONUT (ELECTROSURGICAL) IMPLANT
ELECTRODE REM PT RTRN 9FT ADLT (ELECTROSURGICAL) IMPLANT
GLOVE BIO SURGEON STRL SZ7 (GLOVE) ×1 IMPLANT
GOWN STRL REUS W/TWL LRG LVL3 (GOWN DISPOSABLE) ×1 IMPLANT
HOLDER FOLEY CATH W/STRAP (MISCELLANEOUS) ×1 IMPLANT
IV NS IRRIG 3000ML ARTHROMATIC (IV SOLUTION) ×2 IMPLANT
KIT TURNOVER CYSTO (KITS) ×1 IMPLANT
LOOP CUT BIPOLAR 24F LRG (ELECTROSURGICAL) ×1 IMPLANT
MANIFOLD NEPTUNE II (INSTRUMENTS) ×1 IMPLANT
PACK CYSTO (CUSTOM PROCEDURE TRAY) ×1 IMPLANT
SYR 30ML LL (SYRINGE) ×1 IMPLANT
SYR TOOMEY IRRIG 70ML (MISCELLANEOUS) ×1
SYRINGE TOOMEY IRRIG 70ML (MISCELLANEOUS) ×1 IMPLANT
TUBE CONNECTING 12X1/4 (SUCTIONS) ×1 IMPLANT
TUBING UROLOGY SET (TUBING) ×1 IMPLANT

## 2022-06-07 NOTE — Op Note (Signed)
Operative Note  Preoperative diagnosis:  1.  BPH with bladder outlet obstruction  Postoperative diagnosis: 1.  BPH with bladder outlet obstruction  Procedure(s): 1.  Bipolar transurethral resection of prostate  Surgeon: Rexene Alberts, MD  Assistants:  None  Anesthesia:  General  Complications:  None  EBL:  20ml  Specimens: 1. Prostate chips ID Type Source Tests Collected by Time Destination  1 : protate chips Tissue PATH GU resection / TURBT / partial nephrectomy SURGICAL PATHOLOGY Janith Lima, MD 06/07/2022 1318      Drains/Catheters: 1.  22Fr 3 way catheter with 110ml water into balloon  Intraoperative findings:   Trilobar obstructing prostate with very large median lobe. Excellent resection with wide open prostatic fossa. Excellent hemostasis. No injury.  Indication:  Derrick Manning is a 66 y.o. male with BPH with bladder outlet obstruction presenting for transurethral resection of the prostate. He had a TRUS volume of 77c. Preop cystoscopy demonstrated trilobar obstructing prostate. He has failed alpha blocker.  After thorough discussion including all relevant risk benefits and alternatives, he presents today for a bipolar TURP.  Description of procedure: The indication, alternatives, benefits and risks were discussed with the patient and informed consent was obtained.  Patient was brought to the operating room table, positioned supine, secured with a safety strap.  Pneumatic compression devices were placed on the lower extremities.  After the administration of intravenous antibiotics and general anesthesia, the patient was repositioned into the dorsal lithotomy position.  All pressure points were carefully padded.  A rectal examination was performed confirming a smooth symmetric enlarged gland.  The genitalia were prepped and draped in standard sterile manner.  A timeout was completed, verifying the correct patient, surgical procedure and positioning prior to beginning the  procedure.  Isotonic sodium chloride was used for irrigation.  Lucianne Lei buren sounds were used to dilated his urethra to 28 french. A 26 French continuous-flow resectoscope sheath with the visual obturator and a 30 degree lens was advanced under direct vision into the bladder.  The anterior urethra appeared normal in its entirety.The prostatic urethra was elongated with trilobar hyperplasia.  On cystoscopic evaluation, his bladder capacity appeared normal, the bladder wall was noted to expand symmetrically in all dimensions.  There were no tumors, stones or foreign bodies present. The bladder was trabeculated with normal-appearing mucosa.  Both ureteral orifices were in their normal anatomic positions with clear urinary reflux noted bilaterally.  The obturator was removed and replaced by the working element with a resection loop.  The location of the ureteral orifices and the prostatic configuration were again confirmed.  Starting at the bladder neck and proceeding distally to the verumontanum a transurethral section of the prostate was performed using bipolar using energy of 4 and 5 for cutting and coagulation, respectively.  The procedure began at the bladder neck at the 5 o'clock and 7 o'clock positions and carefully carried distally to the verumontanum, resecting the intervening prostatic adenoma.  Next the left lateral lobe was resected to the level of the transverse capsular fibers.  The identical procedure was performed on the right lobe.  Attention was then directed anteriorly and the resection was completed from the 10 o'clock to 2 o'clock positions.  All bleeding vessels were fulgurated achieving meticulous hemostasis.  The bladder was irrigated with a Toomey syringe, ensuring removal of all prostate chips which were sent to pathology for evaluation.  Having completed the resection and the chips removed, we again confirmed hemostasis with the loop with coagulating  current.  Upon completion of the entire  procedure, the bladder and posterior urethra were reexamined, confirming open prostatic urethra and bladder neck without evidence of bleeding or perforation.  Both ureteral orifices and the external sphincter were noted to be intact.  The resectoscope was withdrawn under direct vision and a 22 French three-way Foley catheter with a 30 cc balloon was inserted into the bladder.  The balloon was inflated with 30 cc of sterile water and the catheter was placed on gentle traction.  After multiple manual irrigations ensuring light pink return of the irrigant, the procedure was terminated.  The catheter was attached to a drainage bag and continuous bladder irrigation was started with normal saline.  The patient was positioned supine.  At the end of the procedure, all counts were correct.  Patient tolerated the procedure well and was taken to the recovery room satisfactory condition.  Plan: Continuous bladder irrigation overnight. Plan to discharge home tomorrow with Foley catheter in place and void trial in the office in 3 days.  Matt R. Jensine Luz MD [facility]  Pager: [phone]

## 2022-06-07 NOTE — H&P (Signed)
Office Visit Report     05/18/2022   --------------------------------------------------------------------------------   Derrick Manning  MRN: [id number]  DOB: 1955/09/24, 66 year old Male  SSN:    PRIMARY CARE:  Tally Joe, MD  REFERRING:  Tally Joe, MD  PROVIDER:  Jettie Pagan, M.D.  TREATING:  Anne Fu, NP  LOCATION:  [facility] [phone]     --------------------------------------------------------------------------------   CC/HPI: Derrick Manning is a 66 year old male seen in follow-up today with TURP.   1. Elevated PSA:  PSA trend:  1.97 on 11/15/2013  1.86 on 12/18/2015  4.43 on 01/20/2017  2.55 on 02/18/2017  2.33 on 01/26/2018  2.64 on 02/05/2019  2.33 on 02/07/2020  4.38 on 03/03/2021  5.5 on 04/03/2021  3.68 on 05/14/2021  -S/p TRUS biopsy 07/16/2021 with BPH in all cores.   He denies recent prostatitis or recurrent urinary tract infections. He denies a family history of prostate cancer. He denies taking 5 alpha reductase inhibitors. He denies sexual activity around the time of last PSA draw. He does state that he engage in heavy exercise and this may be a cause of transient elevated PSA.  -He has upcoming visit with PCP for PSA evaluation.   2. BPH/LUTS: IPSS 6, quality-of-life 3. TRUS volume 07/2021 with 77 g. Cystoscopy 04/12/2022 with trilobar obstructing prostate with intravesical protrusion about 5 mm and large median lobe.  -He has tried tamsulosin and seeing improvement however wants to be off of medications. He would like to schedule TURP.   #3. Erectile dysfunction: Shim score is 23. He states that he has a moderate level of competence of obtaining and maintaining an erection. He does take 5 mg tadalafil once a day.   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 He denies symptoms of low testosterone. He had no problem fathering children.   He has no  significant past medical history. He denies taking anticoagulation.   05/18/2022: Patient with above-noted history. Here today for preoperative appointment prior to undergoing TURP with Dr. Cardell Peach on 12/4. Patient denies any changes in past medical history, prescription medications taken on a daily basis. Patient did undergo a recent procedure with ENT at [facility] on his right ear for management of chronic vertigo. He has already been seen in postop and has been cleared to proceed with upcoming TURP. Please see notes in care everywhere for further detail. He remains on tamsulosin with stability of baseline lower urinary tract symptoms but again he would like to be off of medication with improvement in baseline lower urinary tract symptoms which prompted him to pursue definitive outlet reduction procedure. Patient denies any recent dysuria or gross hematuria. No interval treatment for UTI. Denies any recent chest pain, shortness of breath, lightheadedness or dizziness. No interval fever/chills, nausea/vomiting.     ALLERGIES: No Known Drug Allergies    MEDICATIONS: Tamsulosin Hcl 0.4 mg capsule 1 capsule PO Q HS  Fluticasone Propionate 50 mcg/actuation spray, suspension  Multivitamin  Tadalafil 5 mg tablet     GU PSH: Cystoscopy - 04/12/2022 Prostate Needle Biopsy - 07/16/2021       PSH Notes: Bunion Removal - 2017  Surg for Undescended Testicle - 1966   NON-GU PSH: Surgical Pathology, Gross And Microscopic Examination For Prostate Needle - 07/16/2021 Tonsillectomy - 1967     GU PMH: BPH w/LUTS - 04/12/2022, - 09/15/2021, - 07/16/2021, - 05/14/2021 Elevated PSA - 04/12/2022, -  09/15/2021, - 07/16/2021, - 05/14/2021 Nocturia - 04/12/2022, - 09/15/2021, - 05/14/2021    NON-GU PMH: Arthritis Hypercholesterolemia    FAMILY HISTORY: 1 Daughter - Runs in Family copd - Runs in Family Diabetes - Runs in Family Esophageal Cancer - Runs in Family   SOCIAL HISTORY: Marital Status:  Married Ethnicity: Not Hispanic Or Latino; Race: White Current Smoking Status: Patient does not smoke anymore. Has not smoked since 05/06/1991. Smoked for 15 years. Smoked 1 pack per day.   Tobacco Use Assessment Completed: Used Tobacco in last 30 days? Has never drank.  Drinks 1 caffeinated drink per day.    REVIEW OF SYSTEMS:    GU Review Male:   Patient reports get up at night to urinate. Patient denies frequent urination, hard to postpone urination, burning/ pain with urination, leakage of urine, stream starts and stops, trouble starting your stream, have to strain to urinate , erection problems, and penile pain.  Gastrointestinal (Upper):   Patient denies nausea, vomiting, and indigestion/ heartburn.  Gastrointestinal (Lower):   Patient denies diarrhea and constipation.  Constitutional:   Patient denies fever, night sweats, weight loss, and fatigue.  Skin:   Patient denies skin rash/ lesion and itching.  Eyes:   Patient denies blurred vision and double vision.  Ears/ Nose/ Throat:   Patient denies sore throat and sinus problems.  Hematologic/Lymphatic:   Patient denies swollen glands and easy bruising.  Cardiovascular:   Patient denies leg swelling and chest pains.  Respiratory:   Patient denies cough and shortness of breath.  Endocrine:   Patient denies excessive thirst.  Musculoskeletal:   Patient denies back pain and joint pain.  Neurological:   Patient denies headaches and dizziness.  Psychologic:   Patient denies depression and anxiety.   VITAL SIGNS:      05/18/2022 03:09 PM  Temperature 97.9 F / 36.6 C   MULTI-SYSTEM PHYSICAL EXAMINATION:    Constitutional: Well-nourished. No physical deformities. Normally developed. Good grooming.  Neck: Neck symmetrical, not swollen. Normal tracheal position.  Respiratory: No labored breathing, no use of accessory muscles.   Cardiovascular: Normal temperature, normal extremity pulses, no swelling, no varicosities.  Skin: No paleness, no  jaundice, no cyanosis. No lesion, no ulcer, no rash.  Neurologic / Psychiatric: Oriented to time, oriented to place, oriented to person. No depression, no anxiety, no agitation.  Gastrointestinal: No mass, no tenderness, no rigidity, non obese abdomen.  Musculoskeletal: Normal gait and station of head and neck.     Complexity of Data:  Source Of History:  Patient, Medical Record Summary  Lab Test Review:   PSA  Records Review:   AUA Symptom Score, Pathology Reports, Previous Doctor Records, Previous Hospital Records, Previous Patient Records  Urine Test Review:   Urinalysis, Urine Culture  Urodynamics Review:   Review Bladder Scan   05/14/21  PSA  Total PSA 3.68 ng/mL    PROCEDURES:          Urinalysis w/Scope Dipstick Dipstick Cont'd Micro  Color: Yellow Bilirubin: Neg mg/dL WBC/hpf: NS (Not Seen)  Appearance: Clear Ketones: Neg mg/dL RBC/hpf: 0 - 2/hpf  Specific Gravity: 1.025 Blood: Trace ery/uL Bacteria: NS (Not Seen)  pH: <=5.0 Protein: Neg mg/dL Cystals: NS (Not Seen)  Glucose: Neg mg/dL Urobilinogen: 0.2 mg/dL Casts: NS (Not Seen)    Nitrites: Neg Trichomonas: Not Present    Leukocyte Esterase: Neg leu/uL Mucous: Not Present      Epithelial Cells: NS (Not Seen)      Yeast: NS (Not Seen)  Sperm: Not Present    ASSESSMENT:      ICD-10 Details  1 GU:   BPH w/LUTS - N40.1 Chronic, Stable  2   Nocturia - R35.1 Chronic, Stable   PLAN:            Medications Stop Meds: Clindamycin Hcl 300 mg capsule  Discontinue: 05/18/2022  - Reason: The medication cycle was completed.  Dramamine 25 mg tablet  Discontinue: 05/18/2022  - Reason: The medication cycle was completed.            Orders Labs Urine Culture          Schedule Return Visit/Planned Activity: Keep Scheduled Appointment - Follow up MD, Schedule Surgery          Document Letter(s):  Created for Patient: Clinical Summary         Notes:   All questions answered to the best of my ability regarding  upcoming procedure and expected postoperative course with understanding expressed by the patient. Urine culture sent today to serve as a baseline. He will proceed with previously scheduled TURP with Dr. Cardell PeachGay on 12/4        Next Appointment:      Next Appointment: 06/07/2022 02:00 PM    Appointment Type: Surgery     Location: [facility] [phone]- [phone]    Provider: Jettie PaganMatthew Tamana Hatfield, M.D.    Reason for Visit: NE/OBS BIPOLAR TURP     Urology Preoperative H&P   Chief Complaint: BPH  History of Present Illness: Derrick Manning is a 66 y.o. male with BPH here for TURP. Denies fevers, chills, dysuria.    Past Medical History:  Diagnosis Date   Vertigo     Past Surgical History:  Procedure Laterality Date   BUNIONECTOMY     Labyrinthectomy  2023   orchidopexy      At 66 years old   TONSILLECTOMY      Allergies: No Known Allergies  History reviewed. No pertinent family history.  Social History:  reports that he has never smoked. He has never used smokeless tobacco. He reports that he does not drink alcohol and does not use drugs.  ROS: A complete review of systems was performed.  All systems are negative except for pertinent findings as noted.  Physical Exam:  Vital signs in last 24 hours: Temp:  [98 F (36.7 C)] 98 F (36.7 C) (12/04 1039) Pulse Rate:  [82] 82 (12/04 1039) Resp:  [16] 16 (12/04 1039) BP: (151)/(88) 151/88 (12/04 1039) SpO2:  [99 %] 99 % (12/04 1039) Weight:  [76.6 kg] 76.6 kg (12/04 1039) Constitutional:  Alert and oriented, No acute distress Cardiovascular: Regular rate and rhythm Respiratory: Normal respiratory effort, Lungs clear bilaterally GI: Abdomen is soft, nontender, nondistended, no abdominal masses GU: No CVA tenderness Lymphatic: No lymphadenopathy Neurologic: Grossly intact, no focal deficits Psychiatric: Normal mood and affect  Laboratory Data:  No results for input(s): "WBC", "HGB", "HCT", "PLT" in the last 72 hours.  No  results for input(s): "NA", "K", "CL", "GLUCOSE", "BUN", "CALCIUM", "CREATININE" in the last 72 hours.  Invalid input(s): "CO3"   No results found for this or any previous visit (from the past 24 hour(s)). No results found for this or any previous visit (from the past 240 hour(s)).  Renal Function: No results for input(s): "CREATININE" in the last 168 hours. CrCl cannot be calculated (No successful lab value found.).  Radiologic Imaging: No results found.  I independently reviewed the above imaging studies.  Assessment and Plan Derrick Manning is a 66 y.o. male with BPH here for TURP.   Risks and benefits of Transurethral Resection of the Prostate were reviewed in detail including infection, bleeding, blood transfusion, injury to bladder/urethra/surrounding structures, erectile dysfunction, urinary incontinence, bladder neck contracture, persistent obstructive and irritative voiding symptoms, and global anesthesia risks including but not limited to CVA, MI, DVT, PE, pneumonia, and death. He expressed understanding and desire to proceed.    Matt R. Jamarl Pew MD 06/07/2022, 12:03 PM  [facility] Pager: [phone]): [phone]

## 2022-06-07 NOTE — Transfer of Care (Signed)
Immediate Anesthesia Transfer of Care Note  Patient: Derrick Manning  Procedure(s) Performed: BIPOLAR TRANSURETHRAL RESECTION OF THE PROSTATE (TURP) (Prostate)  Patient Location: PACU  Anesthesia Type:General  Level of Consciousness: awake, alert , oriented, and patient cooperative  Airway & Oxygen Therapy: Patient Spontanous Breathing  Post-op Assessment: Report given to RN and Post -op Vital signs reviewed and stable  Post vital signs: Reviewed and stable  Last Vitals:  Vitals Value Taken Time  BP 140/69 06/07/22 1333  Temp    Pulse 60 06/07/22 1337  Resp 12 06/07/22 1337  SpO2 93 % 06/07/22 1337  Vitals shown include unvalidated device data.  Last Pain:  Vitals:   06/07/22 1106  TempSrc:   PainSc: 0-No pain      Patients Stated Pain Goal: 8 (06/07/22 1106)  Complications: No notable events documented.

## 2022-06-07 NOTE — Anesthesia Procedure Notes (Signed)
Procedure Name: LMA Insertion Date/Time: 06/07/2022 12:23 PM  Performed by: Bishop Limbo, CRNAPre-anesthesia Checklist: Patient identified, Emergency Drugs available, Suction available and Patient being monitored Patient Re-evaluated:Patient Re-evaluated prior to induction Oxygen Delivery Method: Circle System Utilized Preoxygenation: Pre-oxygenation with 100% oxygen Induction Type: IV induction Ventilation: Mask ventilation without difficulty LMA: LMA inserted LMA Size: 5.0 Number of attempts: 1 Placement Confirmation: positive ETCO2 Tube secured with: Tape Dental Injury: Teeth and Oropharynx as per pre-operative assessment

## 2022-06-07 NOTE — Anesthesia Preprocedure Evaluation (Signed)
Anesthesia Evaluation  Patient identified by MRN, date of birth, ID band Patient awake    Reviewed: Allergy & Precautions, NPO status , Patient's Chart, lab work & pertinent test results  Airway Mallampati: II  TM Distance: >3 FB Neck ROM: Full    Dental  (+) Dental Advisory Given   Pulmonary neg pulmonary ROS   breath sounds clear to auscultation       Cardiovascular negative cardio ROS  Rhythm:Regular Rate:Normal     Neuro/Psych negative neurological ROS     GI/Hepatic negative GI ROS, Neg liver ROS,,,  Endo/Other  negative endocrine ROS    Renal/GU negative Renal ROS     Musculoskeletal   Abdominal   Peds  Hematology negative hematology ROS (+)   Anesthesia Other Findings   Reproductive/Obstetrics                             Anesthesia Physical Anesthesia Plan  ASA: 1  Anesthesia Plan: General   Post-op Pain Management: Tylenol PO (pre-op)* and Toradol IV (intra-op)*   Induction: Intravenous  PONV Risk Score and Plan: 2 and Dexamethasone, Ondansetron and Treatment may vary due to age or medical condition  Airway Management Planned: LMA  Additional Equipment:   Intra-op Plan:   Post-operative Plan: Extubation in OR  Informed Consent: I have reviewed the patients History and Physical, chart, labs and discussed the procedure including the risks, benefits and alternatives for the proposed anesthesia with the patient or authorized representative who has indicated his/her understanding and acceptance.     Dental advisory given  Plan Discussed with:   Anesthesia Plan Comments:        Anesthesia Quick Evaluation

## 2022-06-07 NOTE — Discharge Instructions (Signed)

## 2022-06-08 ENCOUNTER — Encounter (HOSPITAL_BASED_OUTPATIENT_CLINIC_OR_DEPARTMENT_OTHER): Payer: Self-pay | Admitting: Urology

## 2022-06-08 DIAGNOSIS — N401 Enlarged prostate with lower urinary tract symptoms: Secondary | ICD-10-CM | POA: Diagnosis not present

## 2022-06-08 LAB — CBC
HCT: 39.3 % (ref 39.0–52.0)
Hemoglobin: 13.1 g/dL (ref 13.0–17.0)
MCH: 31.2 pg (ref 26.0–34.0)
MCHC: 33.3 g/dL (ref 30.0–36.0)
MCV: 93.6 fL (ref 80.0–100.0)
Platelets: 154 10*3/uL (ref 150–400)
RBC: 4.2 MIL/uL — ABNORMAL LOW (ref 4.22–5.81)
RDW: 12.8 % (ref 11.5–15.5)
WBC: 15.5 10*3/uL — ABNORMAL HIGH (ref 4.0–10.5)
nRBC: 0 % (ref 0.0–0.2)

## 2022-06-08 LAB — BASIC METABOLIC PANEL
Anion gap: 5 (ref 5–15)
BUN: 16 mg/dL (ref 8–23)
CO2: 24 mmol/L (ref 22–32)
Calcium: 8.2 mg/dL — ABNORMAL LOW (ref 8.9–10.3)
Chloride: 109 mmol/L (ref 98–111)
Creatinine, Ser: 0.95 mg/dL (ref 0.61–1.24)
GFR, Estimated: >60 mL/min (ref >60)
Glucose, Bld: 117 mg/dL — ABNORMAL HIGH (ref 70–99)
Potassium: 3.8 mmol/L (ref 3.5–5.1)
Sodium: 138 mmol/L (ref 135–145)

## 2022-06-08 LAB — SURGICAL PATHOLOGY

## 2022-06-08 MED ORDER — ACETAMINOPHEN 500 MG PO TABS
ORAL_TABLET | ORAL | Status: AC
  Filled 2022-06-08: qty 2

## 2022-06-08 MED ORDER — HEPARIN SODIUM (PORCINE) 5000 UNIT/ML IJ SOLN
INTRAMUSCULAR | Status: AC
  Filled 2022-06-08: qty 1

## 2022-06-08 NOTE — Anesthesia Postprocedure Evaluation (Signed)
Anesthesia Post Note  Patient: ALPHONSE ASBRIDGE  Procedure(s) Performed: BIPOLAR TRANSURETHRAL RESECTION OF THE PROSTATE (TURP) (Prostate)     Patient location during evaluation: PACU Anesthesia Type: General Level of consciousness: awake and alert Pain management: pain level controlled Vital Signs Assessment: post-procedure vital signs reviewed and stable Respiratory status: spontaneous breathing, nonlabored ventilation, respiratory function stable and patient connected to nasal cannula oxygen Cardiovascular status: blood pressure returned to baseline and stable Postop Assessment: no apparent nausea or vomiting Anesthetic complications: no   No notable events documented.  Last Vitals:  Vitals:   06/08/22 0615 06/08/22 0815  BP: (!) 91/58   Pulse: 87 94  Resp: 16 16  Temp: 36.6 C 36.5 C  SpO2: 96% 94%    Last Pain:  Vitals:   06/08/22 0815  TempSrc:   PainSc: 0-No pain   Pain Goal: Patients Stated Pain Goal: 4 (06/07/22 1700)                 Kennieth Rad

## 2022-06-08 NOTE — Discharge Summary (Signed)
Date of admission: 06/07/2022  Date of discharge: 06/08/2022  Admission diagnosis: BPH  Discharge diagnosis: BPH  Secondary diagnoses: None  History and Physical: For full details, please see admission history and physical. Briefly, Derrick Manning is a 66 y.o. year old patient with BPH who underwent TURP.   Hospital Course: The patient recovered in the usual expected fashion.  He had his diet advanced slowly.  Initially managed with IV pain control, then transitioned to PO meds when he was tolerating oral intake.  His labs were stable throughout the hospital course.  He was discharged to home on POD#1.  At the time of discharge the patient was tolerating a regular diet, passing flatus, ambulating, had adequate pain control and was agreeable to discharge.  Follow up as scheduled.    Laboratory values:  Recent Labs    06/07/22 1728 06/08/22 0700  HGB 14.5 13.1  HCT 44.1 39.3   Recent Labs    06/07/22 1728 06/08/22 0700  CREATININE 1.19 0.95    Disposition: Home  Discharge instruction: The patient was instructed to be ambulatory but told to refrain from heavy lifting, strenuous activity, or driving.   Discharge medications:  Allergies as of 06/08/2022   No Known Allergies      Medication List     TAKE these medications    acetaminophen 650 MG CR tablet Commonly known as: TYLENOL Take 650 mg by mouth every 8 (eight) hours as needed for pain.   cholecalciferol 25 MCG (1000 UNIT) tablet Commonly known as: VITAMIN D3 Take 2,000 Units by mouth daily.   docusate sodium 100 MG capsule Commonly known as: Colace Take 1 capsule (100 mg total) by mouth daily as needed for up to 30 doses.   ibuprofen 200 MG tablet Commonly known as: ADVIL Take 200 mg by mouth every 6 (six) hours as needed for headache or mild pain.   loratadine 10 MG tablet Commonly known as: CLARITIN Take 10 mg by mouth daily.   multivitamin tablet Take 1 tablet by mouth daily.   NON FORMULARY Take  4,000 mg by mouth daily. Beet root   ondansetron 4 MG tablet Commonly known as: Zofran Take 1 tablet (4 mg total) by mouth every 8 (eight) hours as needed.   oxyCODONE-acetaminophen 5-325 MG tablet Commonly known as: Percocet Take 1 tablet by mouth every 4 (four) hours as needed for up to 20 doses for severe pain.   tamsulosin 0.4 MG Caps capsule Commonly known as: FLOMAX Take 0.4 mg by mouth daily after supper.        Followup:   Follow-up Information     [facility] Follow up on 06/10/2022.   Why: 3:45PM Contact information: [address] [place] [address] [phone]        [facility], Dory Peru, MD .                  Dineen Kid. Madden Piazza MD [facility]  Pager: [phone]

## 2023-12-11 ENCOUNTER — Encounter (HOSPITAL_BASED_OUTPATIENT_CLINIC_OR_DEPARTMENT_OTHER): Payer: Self-pay

## 2023-12-11 ENCOUNTER — Ambulatory Visit (HOSPITAL_BASED_OUTPATIENT_CLINIC_OR_DEPARTMENT_OTHER)
Admission: EM | Admit: 2023-12-11 | Discharge: 2023-12-11 | Disposition: A | Discharge status: Home / Self Care | Attending: Family Medicine | Admitting: Family Medicine

## 2023-12-11 DIAGNOSIS — J0110 Acute frontal sinusitis, unspecified: Secondary | ICD-10-CM

## 2023-12-11 LAB — POC COVID19/FLU A&B COMBO
Covid Antigen, POC: NEGATIVE
Influenza A Antigen, POC: NEGATIVE
Influenza B Antigen, POC: NEGATIVE

## 2023-12-11 MED ORDER — AMOXICILLIN-POT CLAVULANATE 875-125 MG PO TABS: 1.0000 | ORAL_TABLET | Freq: Two times a day (BID) | ORAL | 0 refills | Status: DC

## 2023-12-11 NOTE — ED Triage Notes (Signed)
 Runny nose x 2 weeks. Fever yesterday. States about to leave town and concerned with covid or influenza.

## 2023-12-11 NOTE — Discharge Instructions (Signed)
 We will go ahead and treat you for a sinus infection.  Take the antibiotics as prescribed.  Over-the-counter medications for symptoms as needed.  Follow-up as needed

## 2023-12-14 NOTE — ED Provider Notes (Signed)
 [facility]    CSN: [id number] Arrival date & time: 12/11/23  0956      History   Chief Complaint Chief Complaint  Patient presents with   Fever    HPI Derrick Manning is a 68 y.o. male.   68 year old male that presents with rhinorrhea and congestion x 2 weeks. Fever yesterday. Concerned  about being contagious due to he is leaving out of town. Taking OTC meds. No cough, chest congestion, sore throat, ear pain.    Fever   Past Medical History:  Diagnosis Date   Vertigo     Patient Active Problem List   Diagnosis Date Noted   BPH (benign prostatic hyperplasia) 06/07/2022    Past Surgical History:  Procedure Laterality Date   BUNIONECTOMY     Labyrinthectomy  2023   orchidopexy      At 68 years old   TONSILLECTOMY     TRANSURETHRAL RESECTION OF PROSTATE N/A 06/07/2022   Procedure: BIPOLAR TRANSURETHRAL RESECTION OF THE PROSTATE (TURP);  Surgeon: Lahoma Pigg, MD;  Location: [facility];  Service: Urology;  Laterality: N/A;  90 MINUTES NEEDED FOR CASE       Home Medications    Prior to Admission medications   Medication Sig Start Date End Date Taking? Authorizing Provider  amoxicillin-clavulanate (AUGMENTIN) 875-125 MG tablet Take 1 tablet by mouth every 12 (twelve) hours. 12/11/23  Yes Franshesca Chipman A, FNP  acetaminophen  (TYLENOL ) 650 MG CR tablet Take 650 mg by mouth every 8 (eight) hours as needed for pain.    [provider]  cholecalciferol (VITAMIN D3) 25 MCG (1000 UNIT) tablet Take 2,000 Units by mouth daily.    [provider]  docusate sodium  (COLACE) 100 MG capsule Take 1 capsule (100 mg total) by mouth daily as needed for up to 30 doses. 06/07/22   Lahoma Pigg, MD  ibuprofen (ADVIL) 200 MG tablet Take 200 mg by mouth every 6 (six) hours as needed for headache or mild pain.    [provider]  loratadine  (CLARITIN ) 10 MG tablet Take 10 mg by mouth daily.    [provider]  Multiple Vitamin  (MULTIVITAMIN) tablet Take 1 tablet by mouth daily.    [provider]  NON FORMULARY Take 4,000 mg by mouth daily. Beet root    [provider]    Family History History reviewed. No pertinent family history.  Social History Social History   Tobacco Use   Smoking status: Never   Smokeless tobacco: Never  Vaping Use   Vaping status: Never Used  Substance Use Topics   Alcohol use: Never   Drug use: Never     Allergies   Patient has no known allergies.   Review of Systems Review of Systems  Constitutional:  Positive for fever.   See HPI  Physical Exam Triage Vital Signs ED Triage Vitals  Encounter Vitals Group     BP 12/11/23 1157 (!) 147/82     Systolic BP Percentile --      Diastolic BP Percentile --      Pulse Rate 12/11/23 1157 95     Resp 12/11/23 1157 20     Temp 12/11/23 1157 99.7 F (37.6 C)     Temp src --      SpO2 12/11/23 1157 96 %     Weight --      Height --      Head Circumference --  Peak Flow --      Pain Score 12/11/23 1158 0     Pain Loc --      Pain Education --      Exclude from Growth Chart --    No data found.  Updated Vital Signs BP (!) 147/82 (BP Location: Right Arm)   Pulse 95   Temp 99.7 F (37.6 C)   Resp 20   SpO2 96%   Visual Acuity Right Eye Distance:   Left Eye Distance:   Bilateral Distance:    Right Eye Near:   Left Eye Near:    Bilateral Near:     Physical Exam Constitutional:      General: He is not in acute distress.    Appearance: Normal appearance. He is not ill-appearing, toxic-appearing or diaphoretic.  HENT:     Right Ear: Tympanic membrane, ear canal and external ear normal.     Left Ear: Tympanic membrane, ear canal and external ear normal.     Nose: Congestion and rhinorrhea present.     Mouth/Throat:     Pharynx: Oropharynx is clear.  Eyes:     Conjunctiva/sclera: Conjunctivae normal.  Cardiovascular:     Rate and Rhythm: Normal rate and regular rhythm.     Pulses:  Normal pulses.     Heart sounds: Normal heart sounds.  Pulmonary:     Effort: Pulmonary effort is normal.     Breath sounds: Normal breath sounds.  Musculoskeletal:        General: Normal range of motion.  Skin:    General: Skin is warm and dry.  Neurological:     Mental Status: He is alert.  Psychiatric:        Mood and Affect: Mood normal.      UC Treatments / Results  Labs (all labs ordered are listed, but only abnormal results are displayed) Labs Reviewed  POC COVID19/FLU A&B COMBO - Normal    EKG   Radiology No results found.  Procedures Procedures (including critical care time)  Medications Ordered in UC Medications - No data to display  Initial Impression / Assessment and Plan / UC Course  I have reviewed the triage vital signs and the nursing notes.  Pertinent labs & imaging results that were available during my care of the patient were reviewed by me and considered in my medical decision making (see chart for details).     Sinusitis- treating for sinus infection. Amoxicillin as prescribed. OTC meds as needed for symptoms. F/U as needed  Final Clinical Impressions(s) / UC Diagnoses   Final diagnoses:  Acute non-recurrent frontal sinusitis     Discharge Instructions      We will go ahead and treat you for a sinus infection.  Take the antibiotics as prescribed.  Over-the-counter medications for symptoms as needed.  Follow-up as needed  ED Prescriptions     Medication Sig Dispense Auth. Provider   amoxicillin-clavulanate (AUGMENTIN) 875-125 MG tablet Take 1 tablet by mouth every 12 (twelve) hours. 14 tablet Landa Pine, FNP      PDMP not reviewed this encounter.   Landa Pine, FNP 12/14/23 [phone]

## 2023-12-21 ENCOUNTER — Ambulatory Visit (HOSPITAL_BASED_OUTPATIENT_CLINIC_OR_DEPARTMENT_OTHER): Payer: Self-pay | Admitting: Family Medicine

## 2023-12-21 ENCOUNTER — Ambulatory Visit (HOSPITAL_BASED_OUTPATIENT_CLINIC_OR_DEPARTMENT_OTHER)
Admit: 2023-12-21 | Discharge: 2023-12-21 | Disposition: A | Discharge status: Home / Self Care | Attending: Family Medicine | Admitting: Family Medicine

## 2023-12-21 ENCOUNTER — Encounter (HOSPITAL_BASED_OUTPATIENT_CLINIC_OR_DEPARTMENT_OTHER): Payer: Self-pay

## 2023-12-21 ENCOUNTER — Ambulatory Visit (HOSPITAL_BASED_OUTPATIENT_CLINIC_OR_DEPARTMENT_OTHER)
Admission: RE | Admit: 2023-12-21 | Discharge: 2023-12-21 | Disposition: A | Discharge status: Home / Self Care | Source: Ambulatory Visit | Attending: Family Medicine | Admitting: Family Medicine

## 2023-12-21 VITALS — BP 130/78 | HR 91 | Temp 98.7°F | Resp 18

## 2023-12-21 DIAGNOSIS — R509 Fever, unspecified: Secondary | ICD-10-CM | POA: Diagnosis not present

## 2023-12-21 DIAGNOSIS — R051 Acute cough: Secondary | ICD-10-CM

## 2023-12-21 DIAGNOSIS — J0191 Acute recurrent sinusitis, unspecified: Secondary | ICD-10-CM | POA: Diagnosis not present

## 2023-12-21 MED ORDER — AMOXICILLIN-POT CLAVULANATE 875-125 MG PO TABS: 1.0000 | ORAL_TABLET | Freq: Two times a day (BID) | ORAL | 0 refills | Status: AC

## 2023-12-21 NOTE — Progress Notes (Signed)
 Chest X-Ray is negative/no acute findings but if symptoms persist, patient was advised to see his PCP and he might need a Chest CT Scan.  I was able to update the patient via phone.

## 2023-12-21 NOTE — ED Provider Notes (Signed)
 [facility]    CSN: [id number] Arrival date & time: 12/21/23  1302      History   Chief Complaint No chief complaint on file.   HPI Derrick Manning is a 68 y.o. male.   Patient was seen on 12/11/2023 and treated for acute sinusitis with Augmentin  875/125, 1 pill twice daily for 7 days.  He has completed the antibiotic but is persisting with a low-grade fever.  He brought in recorded temperatures for June 17 and 18.  He had temperatures of 98.9 to as high as 99.9.  He wants to be sure that he does not have an active infection because he has a colonoscopy scheduled for early next week.     Past Medical History:  Diagnosis Date   Vertigo     Patient Active Problem List   Diagnosis Date Noted   BPH (benign prostatic hyperplasia) 06/07/2022    Past Surgical History:  Procedure Laterality Date   BUNIONECTOMY     Labyrinthectomy  2023   orchidopexy      At 68 years old   TONSILLECTOMY     TRANSURETHRAL RESECTION OF PROSTATE N/A 06/07/2022   Procedure: BIPOLAR TRANSURETHRAL RESECTION OF THE PROSTATE (TURP);  Surgeon: Lahoma Pigg, MD;  Location: [facility];  Service: Urology;  Laterality: N/A;  90 MINUTES NEEDED FOR CASE       Home Medications    Prior to Admission medications   Medication Sig Start Date End Date Taking? Authorizing Provider  acetaminophen  (TYLENOL ) 650 MG CR tablet Take 650 mg by mouth every 8 (eight) hours as needed for pain.    [provider]  amoxicillin -clavulanate (AUGMENTIN ) 875-125 MG tablet Take 1 tablet by mouth every 12 (twelve) hours for 3 days. 12/21/23 12/24/23  Guss Legacy, FNP  cholecalciferol (VITAMIN D3) 25 MCG (1000 UNIT) tablet Take 2,000 Units by mouth daily.    [provider]  docusate sodium  (COLACE) 100 MG capsule Take 1 capsule (100 mg total) by mouth daily as needed for up to 30 doses. 06/07/22   Lahoma Pigg, MD  ibuprofen (ADVIL) 200 MG tablet Take 200 mg by mouth every 6 (six)  hours as needed for headache or mild pain.    [provider]  loratadine  (CLARITIN ) 10 MG tablet Take 10 mg by mouth daily.    [provider]  Multiple Vitamin (MULTIVITAMIN) tablet Take 1 tablet by mouth daily.    [provider]  NON FORMULARY Take 4,000 mg by mouth daily. Beet root    [provider]    Family History History reviewed. No pertinent family history.  Social History Social History   Tobacco Use   Smoking status: Never   Smokeless tobacco: Never  Vaping Use   Vaping status: Never Used  Substance Use Topics   Alcohol use: Never   Drug use: Never     Allergies   Patient has no known allergies.   Review of Systems Review of Systems  Constitutional:  Positive for fever. Negative for chills.  HENT:  Negative for ear pain and sore throat.   Eyes:  Negative for pain and visual disturbance.  Respiratory:  Positive for cough.   Cardiovascular:  Negative for chest pain and palpitations.  Gastrointestinal:  Negative for abdominal pain, constipation, diarrhea, nausea and vomiting.  Genitourinary:  Negative for dysuria and hematuria.  Musculoskeletal:  Negative for arthralgias and back pain.  Skin:  Negative for color change and rash.  Neurological:  Negative for seizures and syncope.  All other systems reviewed and are negative.    Physical Exam Triage Vital Signs ED Triage Vitals  Encounter Vitals Group     BP 12/21/23 1318 130/78     Girls Systolic BP Percentile --      Girls Diastolic BP Percentile --      Boys Systolic BP Percentile --      Boys Diastolic BP Percentile --      Pulse Rate 12/21/23 1318 91     Resp 12/21/23 1318 18     Temp 12/21/23 1318 98.7 F (37.1 C)     Temp Source 12/21/23 1318 Oral     SpO2 12/21/23 1318 95 %     Weight --      Height --      Head Circumference --      Peak Flow --      Pain Score 12/21/23 1316 0     Pain Loc --      Pain Education --      Exclude from Growth Chart --     No data found.  Updated Vital Signs BP 130/78 (BP Location: Right Arm)   Pulse 91   Temp 98.7 F (37.1 C) (Oral)   Resp 18   SpO2 95%   Visual Acuity Right Eye Distance:   Left Eye Distance:   Bilateral Distance:    Right Eye Near:   Left Eye Near:    Bilateral Near:     Physical Exam Vitals and nursing note reviewed.  Constitutional:      General: He is not in acute distress.    Appearance: He is well-developed. He is not ill-appearing or toxic-appearing.  HENT:     Head: Normocephalic and atraumatic.     Right Ear: Hearing, tympanic membrane, ear canal and external ear normal.     Left Ear: Hearing, tympanic membrane, ear canal and external ear normal.     Nose: No congestion or rhinorrhea.     Right Sinus: No maxillary sinus tenderness or frontal sinus tenderness.     Left Sinus: No maxillary sinus tenderness or frontal sinus tenderness.     Mouth/Throat:     Lips: Pink.     Mouth: Mucous membranes are moist.     Pharynx: Uvula midline. No oropharyngeal exudate or posterior oropharyngeal erythema.     Tonsils: No tonsillar exudate.   Eyes:     Conjunctiva/sclera: Conjunctivae normal.     Pupils: Pupils are equal, round, and reactive to light.    Cardiovascular:     Rate and Rhythm: Normal rate and regular rhythm.     Heart sounds: S1 normal and S2 normal. No murmur heard. Pulmonary:     Effort: Pulmonary effort is normal. No respiratory distress.     Breath sounds: Examination of the right-middle field reveals decreased breath sounds. Examination of the right-lower field reveals decreased breath sounds. Decreased breath sounds present. No wheezing, rhonchi or rales.  Abdominal:     General: Bowel sounds are normal.     Palpations: Abdomen is soft.     Tenderness: There is no abdominal tenderness.   Musculoskeletal:        General: No swelling.     Cervical back: Neck supple.  Lymphadenopathy:     Head:     Right side of head: No submental,  submandibular, tonsillar, preauricular or posterior auricular adenopathy.     Left side of head: No submental, submandibular, tonsillar, preauricular or  posterior auricular adenopathy.     Cervical: No cervical adenopathy.     Right cervical: No superficial cervical adenopathy.    Left cervical: No superficial cervical adenopathy.   Skin:    General: Skin is warm and dry.     Capillary Refill: Capillary refill takes less than 2 seconds.     Findings: No rash.   Neurological:     Mental Status: He is alert and oriented to person, place, and time.   Psychiatric:        Mood and Affect: Mood normal.      UC Treatments / Results  Labs (all labs ordered are listed, but only abnormal results are displayed) Labs Reviewed  CBC WITH DIFFERENTIAL/PLATELET  COMPREHENSIVE METABOLIC PANEL WITH GFR    EKG   Radiology No results found.  Procedures Procedures (including critical care time)  Medications Ordered in UC Medications - No data to display  Initial Impression / Assessment and Plan / UC Course  I have reviewed the triage vital signs and the nursing notes.  Pertinent labs & imaging results that were available during my care of the patient were reviewed by me and considered in my medical decision making (see chart for details).  Plan of Care: Sinusitis with cough and persistent fever: Chest x-ray appears negative.  Will update the patient if the radiology report differs.  CBC with differential and CMP pending.  Will update the patient if the blood work is abnormal.  Exam was essentially normal but he did have some slightly decreased breath sounds on the right.  Will add Augmentin  875/125, 1 pill twice daily for 3 more days, which will be a full 10 days of treatment.  Follow-up if symptoms do not improve, worsen or new symptoms occur.  If symptoms do not resolve, will need to see primary care for further workup.  I reviewed the plan of care with the patient and/or the patient's  guardian.  The patient and/or guardian had time to ask questions and acknowledged that the questions were answered.  I provided instruction on symptoms or reasons to return here or to go to an ER, if symptoms/condition did not improve, worsened or if new symptoms occurred.  Final Clinical Impressions(s) / UC Diagnoses   Final diagnoses:  Acute cough  Fever, unspecified  Acute recurrent sinusitis, unspecified location     Discharge Instructions      Sinusitis with fever and cough: Chest x-ray appears negative.  Will update the patient if the radiology review differs.  Added an additional 3 days of Augmentin /825/125, 1 pill twice daily (6 pills).  Get plenty of fluids and rest.  CBC with differential and CMP are pending.  Will adjust the plan of care, if needed once the blood work results.  If symptoms persist, will need to see primary care for further workup.     ED Prescriptions     Medication Sig Dispense Auth. Provider   amoxicillin -clavulanate (AUGMENTIN ) 875-125 MG tablet Take 1 tablet by mouth every 12 (twelve) hours for 3 days. 6 tablet Elaya Droege, FNP      PDMP not reviewed this encounter.   Guss Legacy, FNP 12/21/23 1444

## 2023-12-21 NOTE — ED Triage Notes (Signed)
 Pt was seen on 06/08 here at urgent care he completed the antibiotic that was prescribed and he is still having a low grade fever around 99 he has a colonoscopy scheduled next week and just want to make sure its okay to still have the procedure.

## 2023-12-21 NOTE — Discharge Instructions (Addendum)
 Sinusitis with fever and cough: Chest x-ray appears negative.  Will update the patient if the radiology review differs.  Added an additional 3 days of Augmentin /825/125, 1 pill twice daily (6 pills).  Get plenty of fluids and rest.  CBC with differential and CMP are pending.  Will adjust the plan of care, if needed once the blood work results.  If symptoms persist, will need to see primary care for further workup.

## 2023-12-22 LAB — COMPREHENSIVE METABOLIC PANEL WITH GFR
ALT: 24 IU/L (ref 0–44)
AST: 24 IU/L (ref 0–40)
Albumin: 4 g/dL (ref 3.9–4.9)
Alkaline Phosphatase: 91 IU/L (ref 44–121)
BUN/Creatinine Ratio: 17 (ref 10–24)
BUN: 18 mg/dL (ref 8–27)
Bilirubin Total: <0.2 mg/dL (ref 0.0–1.2)
CO2: 23 mmol/L (ref 20–29)
Calcium: 9.3 mg/dL (ref 8.6–10.2)
Chloride: 100 mmol/L (ref 96–106)
Creatinine, Ser: 1.03 mg/dL (ref 0.76–1.27)
Globulin, Total: 2.7 g/dL (ref 1.5–4.5)
Glucose: 85 mg/dL (ref 70–99)
Potassium: 4.5 mmol/L (ref 3.5–5.2)
Sodium: 141 mmol/L (ref 134–144)
Total Protein: 6.7 g/dL (ref 6.0–8.5)
eGFR: 78 mL/min/{1.73_m2} (ref >59)

## 2023-12-22 LAB — CBC WITH DIFFERENTIAL/PLATELET
Basophils Absolute: 0 10*3/uL (ref 0.0–0.2)
Basos: 0 %
EOS (ABSOLUTE): 0.2 10*3/uL (ref 0.0–0.4)
Eos: 2 %
Hematocrit: 43.2 % (ref 37.5–51.0)
Hemoglobin: 14.2 g/dL (ref 13.0–17.7)
Immature Grans (Abs): 0 10*3/uL (ref 0.0–0.1)
Immature Granulocytes: 0 %
Lymphocytes Absolute: 1.4 10*3/uL (ref 0.7–3.1)
Lymphs: 13 %
MCH: 29.4 pg (ref 26.6–33.0)
MCHC: 32.9 g/dL (ref 31.5–35.7)
MCV: 89 fL (ref 79–97)
Monocytes Absolute: 1 10*3/uL — ABNORMAL HIGH (ref 0.1–0.9)
Monocytes: 9 %
Neutrophils Absolute: 8.4 10*3/uL — ABNORMAL HIGH (ref 1.4–7.0)
Neutrophils: 76 %
Platelets: 265 10*3/uL (ref 150–450)
RBC: 4.83 x10E6/uL (ref 4.14–5.80)
RDW: 14.1 % (ref 11.6–15.4)
WBC: 11.1 10*3/uL — ABNORMAL HIGH (ref 3.4–10.8)

## 2023-12-22 NOTE — Progress Notes (Signed)
 CMP her chemistry panel is normal with very good kidney function.  CBC with differential shows a marginally elevated WBC and neutrophil count.  These are early signs of mild infection.  He was given an additional 3 days of Augmentin  875/125 mg 1 pill twice daily.  He had fever as high as 100.4 last night but his temperature is 98.6 this morning and he feels like he is turning the corner and kicking the fever.  Advised to follow-up if needed.

## 2024-04-30 ENCOUNTER — Other Ambulatory Visit: Payer: Self-pay | Admitting: Gastroenterology

## 2024-04-30 DIAGNOSIS — R935 Abnormal findings on diagnostic imaging of other abdominal regions, including retroperitoneum: Secondary | ICD-10-CM

## 2024-05-04 ENCOUNTER — Other Ambulatory Visit: Payer: Self-pay | Admitting: Nurse Practitioner

## 2024-05-04 DIAGNOSIS — M545 Low back pain, unspecified: Secondary | ICD-10-CM

## 2024-05-16 ENCOUNTER — Encounter: Payer: Self-pay | Admitting: Radiology

## 2024-05-17 ENCOUNTER — Ambulatory Visit
Admission: RE | Admit: 2024-05-17 | Discharge: 2024-05-17 | Disposition: A | Discharge status: Home / Self Care | Source: Ambulatory Visit | Attending: Nurse Practitioner | Admitting: Nurse Practitioner

## 2024-05-17 ENCOUNTER — Other Ambulatory Visit

## 2024-05-17 ENCOUNTER — Ambulatory Visit
Admission: RE | Admit: 2024-05-17 | Discharge: 2024-05-17 | Disposition: A | Discharge status: Home / Self Care | Source: Ambulatory Visit | Attending: Gastroenterology | Admitting: Gastroenterology

## 2024-05-17 DIAGNOSIS — R935 Abnormal findings on diagnostic imaging of other abdominal regions, including retroperitoneum: Secondary | ICD-10-CM

## 2024-05-17 DIAGNOSIS — M545 Low back pain, unspecified: Secondary | ICD-10-CM

## 2024-05-17 MED ORDER — IOHEXOL 300 MG/ML  SOLN
125.0000 mL | Freq: Once | INTRAMUSCULAR | Status: AC | PRN
  Administered 2024-05-17: 125 mL via INTRAVENOUS

## 2024-07-04 ENCOUNTER — Encounter (HOSPITAL_BASED_OUTPATIENT_CLINIC_OR_DEPARTMENT_OTHER): Payer: Self-pay

## 2024-07-04 ENCOUNTER — Ambulatory Visit (HOSPITAL_BASED_OUTPATIENT_CLINIC_OR_DEPARTMENT_OTHER)
Admission: RE | Admit: 2024-07-04 | Discharge: 2024-07-04 | Disposition: A | Discharge status: Home / Self Care | Source: Ambulatory Visit

## 2024-07-04 VITALS — BP 138/82 | HR 63 | Temp 98.3°F | Resp 20

## 2024-07-04 DIAGNOSIS — H6122 Impacted cerumen, left ear: Secondary | ICD-10-CM | POA: Diagnosis not present

## 2024-07-04 DIAGNOSIS — H9193 Unspecified hearing loss, bilateral: Secondary | ICD-10-CM

## 2024-07-04 NOTE — ED Provider Notes (Signed)
 " [facility]    CSN: [id number] Arrival date & time: 07/04/24  0947      History   Chief Complaint Chief Complaint  Patient presents with   Ear exam    HPI Derrick Manning is a 68 y.o. male.   Patient has an appointment next week to have hearing aids ordered and they are going to mold his ear and ear canal some.  He had his ears cleaned on 06/07/2024 but wanted to be sure his ears were still clean.  He reports that a hearing aid facility will not clean his ears.     Past Medical History:  Diagnosis Date   Vertigo     Patient Active Problem List   Diagnosis Date Noted   BPH (benign prostatic hyperplasia) 06/07/2022    Past Surgical History:  Procedure Laterality Date   BUNIONECTOMY     Labyrinthectomy  2023   orchidopexy      At 68 years old   TONSILLECTOMY     TRANSURETHRAL RESECTION OF PROSTATE N/A 06/07/2022   Procedure: BIPOLAR TRANSURETHRAL RESECTION OF THE PROSTATE (TURP);  Surgeon: Selma Donnice SAUNDERS, MD;  Location: [facility];  Service: Urology;  Laterality: N/A;  90 MINUTES NEEDED FOR CASE       Home Medications    Prior to Admission medications  Medication Sig Start Date End Date Taking? Authorizing Provider  acetaminophen  (TYLENOL ) 650 MG CR tablet Take 650 mg by mouth every 8 (eight) hours as needed for pain.    [provider]  cholecalciferol (VITAMIN D3) 25 MCG (1000 UNIT) tablet Take 2,000 Units by mouth daily.    [provider]  docusate sodium  (COLACE) 100 MG capsule Take 1 capsule (100 mg total) by mouth daily as needed for up to 30 doses. 06/07/22   Selma Donnice SAUNDERS, MD  ibuprofen (ADVIL) 200 MG tablet Take 200 mg by mouth every 6 (six) hours as needed for headache or mild pain.    [provider]  loratadine  (CLARITIN ) 10 MG tablet Take 10 mg by mouth daily.    [provider]  Multiple Vitamin (MULTIVITAMIN) tablet Take 1 tablet by mouth daily.    [provider]  NON  FORMULARY Take 4,000 mg by mouth daily. Beet root    [provider]    Family History History reviewed. No pertinent family history.  Social History Social History[1]   Allergies   Patient has no known allergies.   Review of Systems Review of Systems  Constitutional:  Negative for fever.  HENT:  Positive for ear discharge and hearing loss.   Respiratory:  Negative for cough.   Cardiovascular:  Negative for chest pain.  Gastrointestinal:  Negative for abdominal pain, constipation, diarrhea, nausea and vomiting.  Musculoskeletal:  Negative for arthralgias and back pain.  Skin:  Negative for color change and rash.  Neurological:  Negative for syncope.  All other systems reviewed and are negative.    Physical Exam Triage Vital Signs ED Triage Vitals  Encounter Vitals Group     BP 07/04/24 1045 138/82     Girls Systolic BP Percentile --      Girls Diastolic BP Percentile --      Boys Systolic BP Percentile --      Boys Diastolic BP Percentile --      Pulse Rate 07/04/24 1045 63     Resp 07/04/24 1045 20     Temp 07/04/24 1045 98.3 F (36.8 C)  Temp Source 07/04/24 1045 Oral     SpO2 07/04/24 1045 95 %     Weight --      Height --      Head Circumference --      Peak Flow --      Pain Score 07/04/24 1046 0     Pain Loc --      Pain Education --      Exclude from Growth Chart --    No data found.  Updated Vital Signs BP 138/82 (BP Location: Right Arm)   Pulse 63   Temp 98.3 F (36.8 C) (Oral)   Resp 20   SpO2 95%   Visual Acuity Right Eye Distance:   Left Eye Distance:   Bilateral Distance:    Right Eye Near:   Left Eye Near:    Bilateral Near:     Physical Exam Vitals and nursing note reviewed.  Constitutional:      General: He is not in acute distress.    Appearance: He is well-developed. He is not ill-appearing or toxic-appearing.  HENT:     Head: Normocephalic and atraumatic.     Right Ear: Hearing, tympanic membrane, ear canal  and external ear normal.     Left Ear: Hearing, tympanic membrane and external ear normal. There is impacted cerumen (Small amount of hard wax in the left ear.  This was easily removed with a curette.  Patient had a little bit of pain with the procedure but no bleeding and no abrasion in the canal.).     Ears:     Comments: Canal is normal, clear of all wax after removal of the wax with a curette.    Nose: No congestion or rhinorrhea.     Right Sinus: No maxillary sinus tenderness or frontal sinus tenderness.     Left Sinus: No maxillary sinus tenderness or frontal sinus tenderness.     Mouth/Throat:     Lips: Pink.     Mouth: Mucous membranes are moist.     Pharynx: Uvula midline. No oropharyngeal exudate or posterior oropharyngeal erythema.     Tonsils: No tonsillar exudate.  Eyes:     Conjunctiva/sclera: Conjunctivae normal.     Pupils: Pupils are equal, round, and reactive to light.  Cardiovascular:     Rate and Rhythm: Normal rate and regular rhythm.     Heart sounds: S1 normal and S2 normal. No murmur heard. Pulmonary:     Effort: Pulmonary effort is normal. No respiratory distress.     Breath sounds: Normal breath sounds. No decreased breath sounds, wheezing, rhonchi or rales.  Musculoskeletal:        General: No swelling.     Cervical back: Neck supple.  Lymphadenopathy:     Head:     Right side of head: No submental, submandibular, tonsillar, preauricular or posterior auricular adenopathy.     Left side of head: No submental, submandibular, tonsillar, preauricular or posterior auricular adenopathy.     Cervical: No cervical adenopathy.     Right cervical: No superficial cervical adenopathy.    Left cervical: No superficial cervical adenopathy.  Skin:    General: Skin is warm and dry.     Capillary Refill: Capillary refill takes less than 2 seconds.     Findings: No rash.  Neurological:     Mental Status: He is alert and oriented to person, place, and time.  Psychiatric:         Mood and Affect: Mood normal.  UC Treatments / Results  Labs (all labs ordered are listed, but only abnormal results are displayed) Labs Reviewed - No data to display  EKG   Radiology No results found.  Procedures Procedures (including critical care time)  Medications Ordered in UC Medications - No data to display  Initial Impression / Assessment and Plan / UC Course  I have reviewed the triage vital signs and the nursing notes.  Pertinent labs & imaging results that were available during my care of the patient were reviewed by me and considered in my medical decision making (see chart for details).  Plan of Care (see discharge instructions for additional patient precautions and education): Hearing loss and patient is preparing for hearing aids with some cerumen in the left ear: Minimal cerumen in the left ear.  Right ear is completely clear.  Able to clear the left ear with a curette.  No abrasions nor bleeding in the left ear.  See discharge instruction for methods to clean ears in the future.  Follow-up with hearing aids as planned.  Return here if needed  I reviewed the plan of care with the patient and/or the patient's guardian.  The patient and/or guardian had time to ask questions and acknowledged that the questions were answered.  Final Clinical Impressions(s) / UC Diagnoses   Final diagnoses:  Bilateral hearing loss, unspecified hearing loss type  Impacted cerumen, left ear     Discharge Instructions      Hearing loss and patient is preparing for hearing aids with some cerumen in the left ear: Minimal cerumen in the left ear.  Right ear is completely clear.  Able to clear the left ear with a curette.  No abrasions nor bleeding in the left ear.  See below for methods to clean ears in the future.  Follow-up with hearing aids as planned.  Return here if needed  There is hard wax in your ear(s).  It should be softened before someone tries to rinse out  the ears.  Use Colace gelcaps (an over-the-counter stool softener).  Use a large needle or a safety pin to poke a hole in 1 part of the gelcap.  Squeeze 1 or 2 gelcaps into an ear canal and then put a cottonball in the ear.  Do this to both ears if both ears have wax.  Do this at night prior to sleep (for 2-4 nights) and just prior to a planned visit for ear cleaning.  This will soften the wax so that it is easy to clean your ears out.      ED Prescriptions   None    PDMP not reviewed this encounter.    [1]  Social History Tobacco Use   Smoking status: Never   Smokeless tobacco: Never  Vaping Use   Vaping status: Never Used  Substance Use Topics   Alcohol use: Never   Drug use: Never     Ival Domino, FNP 07/04/24 1116  "

## 2024-07-04 NOTE — ED Triage Notes (Signed)
 Patient will be having hearing aides. Had ears irrigated on December 4th already but wants to make sure everything still looks good before his upcoming appointment.

## 2024-07-04 NOTE — Discharge Instructions (Addendum)
 Hearing loss and patient is preparing for hearing aids with some cerumen in the left ear: Minimal cerumen in the left ear.  Right ear is completely clear.  Able to clear the left ear with a curette.  No abrasions nor bleeding in the left ear.  See below for methods to clean ears in the future.  Follow-up with hearing aids as planned.  Return here if needed  There is hard wax in your ear(s).  It should be softened before someone tries to rinse out the ears.  Use Colace gelcaps (an over-the-counter stool softener).  Use a large needle or a safety pin to poke a hole in 1 part of the gelcap.  Squeeze 1 or 2 gelcaps into an ear canal and then put a cottonball in the ear.  Do this to both ears if both ears have wax.  Do this at night prior to sleep (for 2-4 nights) and just prior to a planned visit for ear cleaning.  This will soften the wax so that it is easy to clean your ears out.

## 2024-08-03 ENCOUNTER — Encounter (HOSPITAL_BASED_OUTPATIENT_CLINIC_OR_DEPARTMENT_OTHER): Payer: Self-pay

## 2024-08-03 ENCOUNTER — Ambulatory Visit (INDEPENDENT_AMBULATORY_CARE_PROVIDER_SITE_OTHER): Admitting: Radiology

## 2024-08-03 ENCOUNTER — Ambulatory Visit (HOSPITAL_BASED_OUTPATIENT_CLINIC_OR_DEPARTMENT_OTHER)
Admission: RE | Admit: 2024-08-03 | Discharge: 2024-08-03 | Disposition: A | Discharge status: Home / Self Care | Attending: Nurse Practitioner

## 2024-08-03 ENCOUNTER — Other Ambulatory Visit: Payer: Self-pay

## 2024-08-03 VITALS — BP 128/75 | HR 78 | Temp 97.9°F | Resp 18

## 2024-08-03 DIAGNOSIS — R059 Cough, unspecified: Secondary | ICD-10-CM | POA: Diagnosis not present

## 2024-08-03 DIAGNOSIS — R630 Anorexia: Secondary | ICD-10-CM | POA: Diagnosis not present

## 2024-08-03 DIAGNOSIS — R051 Acute cough: Secondary | ICD-10-CM | POA: Diagnosis not present

## 2024-08-03 DIAGNOSIS — J189 Pneumonia, unspecified organism: Secondary | ICD-10-CM

## 2024-08-03 DIAGNOSIS — R5383 Other fatigue: Secondary | ICD-10-CM

## 2024-08-03 DIAGNOSIS — R509 Fever, unspecified: Secondary | ICD-10-CM

## 2024-08-03 LAB — POC SOFIA SARS ANTIGEN FIA: SARS Coronavirus 2 Ag: NEGATIVE

## 2024-08-03 MED ORDER — PREDNISONE 20 MG PO TABS: 40.0000 mg | ORAL_TABLET | Freq: Every day | ORAL | 0 refills | Status: AC

## 2024-08-03 MED ORDER — ALBUTEROL SULFATE (2.5 MG/3ML) 0.083% IN NEBU
2.5000 mg | INHALATION_SOLUTION | Freq: Once | RESPIRATORY_TRACT | Status: AC
  Administered 2024-08-03: 2.5 mg via RESPIRATORY_TRACT

## 2024-08-03 MED ORDER — AZITHROMYCIN 250 MG PO TABS: 250.0000 mg | ORAL_TABLET | Freq: Every day | ORAL | 0 refills | Status: AC

## 2024-08-03 NOTE — Discharge Instructions (Signed)
 You have symptoms of a lower respiratory infection, which may represent an early or atypical pneumonia. Even though your vital signs are stable and you are breathing comfortably, your recent fever, cough with mucus, fatigue, and abnormal lung sounds suggest an infection in the lungs that needs treatment.  Start the prescribed azithromycin  and prednisone  today and take them exactly as directed until finished. Rest as much as possible and drink plenty of fluids to stay hydrated. You may use acetaminophen  or ibuprofen for fever, body aches, or discomfort if you are able to take these medications. Warm fluids, a humidifier, and gentle coughing to clear mucus. You should continue to notice gradual improvement over the next few days. Schedule a follow up visit with your primary care provider within about one week to make sure you are getting better. You should see him sooner or return to the urgent care if your cough, fever, or fatigue are not improving after several days on antibiotics.  Go to the emergency department right away if you develop trouble breathing, chest pain, confusion, bluish lips or fingertips, persistent high fever, dizziness or fainting, inability to keep fluids down, or if your condition suddenly worsens.

## 2024-08-03 NOTE — ED Triage Notes (Signed)
 Pt is here with a fever that started 5 days ago, pt has taken OTC meds to relieve discomfort.
